# Patient Record
Sex: Female | Born: 1995 | Race: White | Hispanic: No | Marital: Single | Smoking: Never smoker
Health system: Southern US, Community
[De-identification: ages and names within clinical notes are randomized; demographics above are authoritative.]

## PROBLEM LIST (undated history)

## (undated) DIAGNOSIS — K589 Irritable bowel syndrome without diarrhea: Secondary | ICD-10-CM

## (undated) DIAGNOSIS — R51 Headache: Secondary | ICD-10-CM

## (undated) DIAGNOSIS — R519 Headache, unspecified: Secondary | ICD-10-CM

## (undated) DIAGNOSIS — R109 Unspecified abdominal pain: Secondary | ICD-10-CM

## (undated) DIAGNOSIS — M797 Fibromyalgia: Secondary | ICD-10-CM

## (undated) DIAGNOSIS — K219 Gastro-esophageal reflux disease without esophagitis: Secondary | ICD-10-CM

## (undated) DIAGNOSIS — R634 Abnormal weight loss: Secondary | ICD-10-CM

## (undated) HISTORY — PX: CHOLECYSTECTOMY: SHX55

## (undated) HISTORY — DX: Abnormal weight loss: R63.4

---

## 2016-01-07 ENCOUNTER — Emergency Department (HOSPITAL_COMMUNITY)
Admission: EM | Admit: 2016-01-07 | Discharge: 2016-01-07 | Disposition: A | Discharge status: Home / Self Care | Payer: BC Managed Care – PPO | Attending: Emergency Medicine | Admitting: Emergency Medicine

## 2016-01-07 ENCOUNTER — Encounter (HOSPITAL_COMMUNITY): Payer: Self-pay | Admitting: *Deleted

## 2016-01-07 DIAGNOSIS — R002 Palpitations: Secondary | ICD-10-CM | POA: Diagnosis not present

## 2016-01-07 DIAGNOSIS — R110 Nausea: Secondary | ICD-10-CM | POA: Diagnosis not present

## 2016-01-07 DIAGNOSIS — Z79899 Other long term (current) drug therapy: Secondary | ICD-10-CM | POA: Insufficient documentation

## 2016-01-07 DIAGNOSIS — R200 Anesthesia of skin: Secondary | ICD-10-CM | POA: Diagnosis not present

## 2016-01-07 DIAGNOSIS — R072 Precordial pain: Secondary | ICD-10-CM | POA: Insufficient documentation

## 2016-01-07 DIAGNOSIS — R000 Tachycardia, unspecified: Secondary | ICD-10-CM | POA: Diagnosis present

## 2016-01-07 LAB — CBC WITH DIFFERENTIAL/PLATELET
BASOS ABS: 0 10*3/uL (ref 0.0–0.1)
Basophils Relative: 0 %
EOS PCT: 0 %
Eosinophils Absolute: 0 10*3/uL (ref 0.0–0.7)
HEMATOCRIT: 40.1 % (ref 36.0–46.0)
Hemoglobin: 14.2 g/dL (ref 12.0–15.0)
LYMPHS ABS: 2.2 10*3/uL (ref 0.7–4.0)
LYMPHS PCT: 27 %
MCH: 30.2 pg (ref 26.0–34.0)
MCHC: 35.4 g/dL (ref 30.0–36.0)
MCV: 85.3 fL (ref 78.0–100.0)
MONOS PCT: 5 %
Monocytes Absolute: 0.4 10*3/uL (ref 0.1–1.0)
Neutro Abs: 5.4 10*3/uL (ref 1.7–7.7)
Neutrophils Relative %: 68 %
PLATELETS: 241 10*3/uL (ref 150–400)
RBC: 4.7 MIL/uL (ref 3.87–5.11)
RDW: 12.6 % (ref 11.5–15.5)
WBC: 8.1 10*3/uL (ref 4.0–10.5)

## 2016-01-07 LAB — TSH: TSH: 0.466 u[IU]/mL (ref 0.350–4.500)

## 2016-01-07 LAB — BASIC METABOLIC PANEL
ANION GAP: 5 (ref 5–15)
BUN: 16 mg/dL (ref 6–20)
CALCIUM: 8.4 mg/dL — AB (ref 8.9–10.3)
CO2: 21 mmol/L — AB (ref 22–32)
CREATININE: 0.55 mg/dL (ref 0.44–1.00)
Chloride: 106 mmol/L (ref 101–111)
Glucose, Bld: 80 mg/dL (ref 65–99)
Potassium: 3.4 mmol/L — ABNORMAL LOW (ref 3.5–5.1)
SODIUM: 132 mmol/L — AB (ref 135–145)

## 2016-01-07 LAB — TROPONIN I: Troponin I: <0.03 ng/mL (ref <0.03)

## 2016-01-07 LAB — POC URINE PREG, ED: PREG TEST UR: NEGATIVE

## 2016-01-07 MED ORDER — HYDROCODONE-ACETAMINOPHEN 5-325 MG PO TABS: 1.0000 | ORAL_TABLET | ORAL | 0 refills | Status: DC | PRN

## 2016-01-07 MED ORDER — HYDROCODONE-ACETAMINOPHEN 5-325 MG PO TABS
1.0000 | ORAL_TABLET | Freq: Once | ORAL | Status: AC
  Administered 2016-01-07: 1 via ORAL
  Filled 2016-01-07: qty 1

## 2016-01-07 MED ORDER — TRAMADOL HCL 50 MG PO TABS
50.0000 mg | ORAL_TABLET | Freq: Once | ORAL | Status: DC
  Filled 2016-01-07: qty 1

## 2016-01-07 NOTE — ED Provider Notes (Signed)
[facility]-EMERGENCY DEPT Provider Note   CSN: [id number] Arrival date & time: 01/07/16  1845     History   Chief Complaint Chief Complaint  Patient presents with  . Tachycardia    HPI Misty Figueroa is a 20 y.o. female presenting with at least a several week history of palpitations and general feeling of weakness with these episodes.  She describes intermittent episodes of heart racing which can occur randomly, at rest or with exertion and has been more persistent today describing episodes lasting a few minutes throughout the day.  She has also developed new midsternal chest pain today which is sharp and aching in character without radiation. She was seen by her pcp for this yesterday whom she said wasn't listening to her and prescribed zoloft.  She denies depression.  She is undergoing testing for vague symptoms including weakness, intermittent hives and and allover body aches In addition to all over body tingling which is intermittent and has been seen by a rheumatologist and an allergist who have been unable to identify the source of her symptoms. She denies fevers,chills, but does have intermittent nausea and hot flashes followed by cold chills.  Her weight has been stable.  The history is provided by the patient and a parent.    Past Medical History:  Diagnosis Date  . Premature baby    born at 6627 weeks    There are no active problems to display for this patient.   Past Surgical History:  Procedure Laterality Date  . CHOLECYSTECTOMY      OB History    No data available       Home Medications    Prior to Admission medications   Medication Sig Start Date End Date Taking? Authorizing Provider  fexofenadine (ALLEGRA) 180 MG tablet Take 180 mg by mouth daily.   Yes Historical Provider, MD  Norethindrone-Ethinyl Estradiol-Fe Biphas (LO LOESTRIN FE) 1 MG-10 MCG / 10 MCG tablet Take 1 tablet by mouth daily.   Yes Historical Provider, MD  sertraline (ZOLOFT) 50 MG tablet Take 50  mg by mouth daily. 01/06/16  Yes Historical Provider, MD  HYDROcodone-acetaminophen (NORCO/VICODIN) 5-325 MG tablet Take 1 tablet by mouth every 4 (four) hours as needed. 01/07/16   Burgess AmorJulie Cinderella Christoffersen, PA-C    Family History No family history on file.  Social History Social History  Substance Use Topics  . Smoking status: Never Smoker  . Smokeless tobacco: Never Used  . Alcohol use No     Allergies   Penicillins and Prochlorperazine   Review of Systems Review of Systems  Constitutional: Positive for chills and diaphoresis. Negative for fever.  HENT: Negative for congestion and sore throat.   Eyes: Negative.   Respiratory: Negative for chest tightness and shortness of breath.   Cardiovascular: Positive for chest pain and palpitations.  Gastrointestinal: Positive for nausea. Negative for abdominal pain and vomiting.  Endocrine: Negative for polydipsia and polyuria.  Genitourinary: Negative.   Musculoskeletal: Negative for arthralgias, joint swelling and neck pain.  Skin: Negative.  Negative for rash and wound.  Neurological: Positive for weakness and numbness. Negative for dizziness, light-headedness and headaches.  Psychiatric/Behavioral: Negative.      Physical Exam Updated Vital Signs BP 104/68   Pulse 75   Temp 97.9 F (36.6 C) (Oral)   Resp 24   Ht 5\' 4"  (1.626 m)   Wt 43.1 kg   LMP 01/03/2016   SpO2 97%   BMI 16.31 kg/m   Physical Exam  Constitutional: She appears  well-developed and well-nourished.  HENT:  [place]: Normocephalic and atraumatic.  Eyes: Conjunctivae are normal.  Neck: Normal range of motion.  Cardiovascular: Normal rate, regular rhythm, normal heart sounds and intact distal pulses.   No murmur heard. No palpitations, arrhythmia or episodes of tachycardia while on the monitor.  Pulmonary/Chest: Effort normal and breath sounds normal. She has no wheezes.  Abdominal: Soft. Bowel sounds are normal. There is no tenderness.  Musculoskeletal: Normal range  of motion.  Neurological: She is alert. She has normal reflexes. No cranial nerve deficit. She exhibits normal muscle tone. Coordination normal.  Skin: Skin is warm and dry.  Psychiatric: She has a normal mood and affect.  Nursing note and vitals reviewed.    ED Treatments / Results  Labs (all labs ordered are listed, but only abnormal results are displayed)  Results for orders placed or performed during the hospital encounter of 01/07/16  CBC with Differential  Result Value Ref Range   WBC 8.1 4.0 - 10.5 K/uL   RBC 4.70 3.87 - 5.11 MIL/uL   Hemoglobin 14.2 12.0 - 15.0 g/dL   HCT 16.1 09.6 - 04.5 %   MCV 85.3 78.0 - 100.0 fL   MCH 30.2 26.0 - 34.0 pg   MCHC 35.4 30.0 - 36.0 g/dL   RDW 40.9 81.1 - 91.4 %   Platelets 241 150 - 400 K/uL   Neutrophils Relative % 68 %   Neutro Abs 5.4 1.7 - 7.7 K/uL   Lymphocytes Relative 27 %   Lymphs Abs 2.2 0.7 - 4.0 K/uL   Monocytes Relative 5 %   Monocytes Absolute 0.4 0.1 - 1.0 K/uL   Eosinophils Relative 0 %   Eosinophils Absolute 0.0 0.0 - 0.7 K/uL   Basophils Relative 0 %   Basophils Absolute 0.0 0.0 - 0.1 K/uL  Basic metabolic panel  Result Value Ref Range   Sodium 132 (L) 135 - 145 mmol/L   Potassium 3.4 (L) 3.5 - 5.1 mmol/L   Chloride 106 101 - 111 mmol/L   CO2 21 (L) 22 - 32 mmol/L   Glucose, Bld 80 65 - 99 mg/dL   BUN 16 6 - 20 mg/dL   Creatinine, Ser 7.82 0.44 - 1.00 mg/dL   Calcium 8.4 (L) 8.9 - 10.3 mg/dL   GFR calc non Af Amer >60 >60 mL/min   GFR calc Af Amer >60 >60 mL/min   Anion gap 5 5 - 15  Troponin I  Result Value Ref Range   Troponin I <0.03 <0.03 ng/mL  TSH  Result Value Ref Range   TSH 0.466 0.350 - 4.500 uIU/mL  POC urine preg, ED (not at [facility])  Result Value Ref Range   Preg Test, Ur NEGATIVE NEGATIVE   No results found.  EKG  EKG Interpretation  Date/Time:  Thursday January 07 2016 22:34:04 EDT Ventricular Rate:  69 PR Interval:    QRS Duration: 75 QT Interval:  381 QTC Calculation: 409 R  Axis:   66 Text Interpretation:  Sinus rhythm Short PR interval No STEMI. Similar to prior tracing.  Confirmed by LONG MD, JOSHUA [phone]) on 01/07/2016 10:37:38 PM       Radiology No results found.  Procedures Procedures (including critical care time)  Medications Ordered in ED Medications  HYDROcodone-acetaminophen (NORCO/VICODIN) 5-325 MG per tablet 1 tablet (1 tablet Oral Given 01/07/16 2346)     Initial Impression / Assessment and Plan / ED Course  I have reviewed the triage vital signs and the nursing notes.  Pertinent labs & imaging results that were available during my care of the patient were reviewed by me and considered in my medical decision making (see chart for details).  Clinical Course    History of palpitations with no symptoms during this ED visit.  Labs and EKG reviewed.  Patient advise follow-up with her PCP when necessary.  She endorses interest in locating a closer PCP to this area and was given referrals including Dr. Selena Batten or Karilyn Cota.  Also referred to cardiology, she may benefit from a Holter monitor to further define palpitations unable to see during tonight's visit.  During the visit she had exacerbation overall over body pain.  She was given hydrocodone tablet with a prescription for a small supply.  She has been prescribed naproxen and meloxicam in the past which does not improve the symptoms.  Final Clinical Impressions(s) / ED Diagnoses   Final diagnoses:  Palpitations    New Prescriptions New Prescriptions   HYDROCODONE-ACETAMINOPHEN (NORCO/VICODIN) 5-325 MG TABLET    Take 1 tablet by mouth every 4 (four) hours as needed.     Burgess Amor, PA-C 01/07/16 2348    Maia Plan, MD 01/08/16 [phone]

## 2016-01-07 NOTE — ED Triage Notes (Signed)
Pt c/o feeling like her heart is beating fast, feeling weak for the past few weeks, was seen by pcp yesterday and was placed on zoloft 50 mg daily, chest pain started today,

## 2016-01-21 ENCOUNTER — Emergency Department (HOSPITAL_COMMUNITY): Payer: BC Managed Care – PPO

## 2016-01-21 ENCOUNTER — Encounter (HOSPITAL_COMMUNITY): Payer: Self-pay

## 2016-01-21 ENCOUNTER — Emergency Department (HOSPITAL_COMMUNITY)
Admission: EM | Admit: 2016-01-21 | Discharge: 2016-01-21 | Disposition: A | Discharge status: Home / Self Care | Payer: BC Managed Care – PPO | Attending: Emergency Medicine | Admitting: Emergency Medicine

## 2016-01-21 DIAGNOSIS — Z79899 Other long term (current) drug therapy: Secondary | ICD-10-CM | POA: Insufficient documentation

## 2016-01-21 DIAGNOSIS — K529 Noninfective gastroenteritis and colitis, unspecified: Secondary | ICD-10-CM | POA: Insufficient documentation

## 2016-01-21 DIAGNOSIS — R109 Unspecified abdominal pain: Secondary | ICD-10-CM | POA: Diagnosis present

## 2016-01-21 LAB — URINALYSIS, ROUTINE W REFLEX MICROSCOPIC
BILIRUBIN URINE: NEGATIVE
GLUCOSE, UA: NEGATIVE mg/dL
KETONES UR: NEGATIVE mg/dL
LEUKOCYTES UA: NEGATIVE
Nitrite: NEGATIVE
PROTEIN: NEGATIVE mg/dL
Specific Gravity, Urine: 1.02 (ref 1.005–1.030)
pH: 6 (ref 5.0–8.0)

## 2016-01-21 LAB — I-STAT BETA HCG BLOOD, ED (MC, WL, AP ONLY)

## 2016-01-21 LAB — COMPREHENSIVE METABOLIC PANEL
ALT: 14 U/L (ref 14–54)
AST: 17 U/L (ref 15–41)
Albumin: 4.4 g/dL (ref 3.5–5.0)
Alkaline Phosphatase: 60 U/L (ref 38–126)
Anion gap: 6 (ref 5–15)
BUN: 8 mg/dL (ref 6–20)
CHLORIDE: 104 mmol/L (ref 101–111)
CO2: 27 mmol/L (ref 22–32)
CREATININE: 0.69 mg/dL (ref 0.44–1.00)
Calcium: 8.9 mg/dL (ref 8.9–10.3)
Glucose, Bld: 103 mg/dL — ABNORMAL HIGH (ref 65–99)
POTASSIUM: 3.3 mmol/L — AB (ref 3.5–5.1)
SODIUM: 137 mmol/L (ref 135–145)
TOTAL PROTEIN: 7.8 g/dL (ref 6.5–8.1)
Total Bilirubin: 0.4 mg/dL (ref 0.3–1.2)

## 2016-01-21 LAB — URINE MICROSCOPIC-ADD ON

## 2016-01-21 LAB — CBC WITH DIFFERENTIAL/PLATELET
Basophils Absolute: 0 10*3/uL (ref 0.0–0.1)
Basophils Relative: 0 %
EOS ABS: 0.1 10*3/uL (ref 0.0–0.7)
Eosinophils Relative: 2 %
HEMATOCRIT: 41 % (ref 36.0–46.0)
HEMOGLOBIN: 14.2 g/dL (ref 12.0–15.0)
LYMPHS ABS: 2.4 10*3/uL (ref 0.7–4.0)
LYMPHS PCT: 32 %
MCH: 30.2 pg (ref 26.0–34.0)
MCHC: 34.6 g/dL (ref 30.0–36.0)
MCV: 87.2 fL (ref 78.0–100.0)
MONOS PCT: 6 %
Monocytes Absolute: 0.4 10*3/uL (ref 0.1–1.0)
NEUTROS PCT: 60 %
Neutro Abs: 4.5 10*3/uL (ref 1.7–7.7)
Platelets: 241 10*3/uL (ref 150–400)
RBC: 4.7 MIL/uL (ref 3.87–5.11)
RDW: 13.2 % (ref 11.5–15.5)
WBC: 7.4 10*3/uL (ref 4.0–10.5)

## 2016-01-21 MED ORDER — ONDANSETRON 4 MG PO TBDP: ORAL_TABLET | ORAL | 0 refills | Status: DC

## 2016-01-21 MED ORDER — HYDROCODONE-ACETAMINOPHEN 5-325 MG PO TABS: 1.0000 | ORAL_TABLET | Freq: Four times a day (QID) | ORAL | 0 refills | Status: DC | PRN

## 2016-01-21 MED ORDER — SODIUM CHLORIDE 0.9 % IV BOLUS (SEPSIS)
1000.0000 mL | Freq: Once | INTRAVENOUS | Status: AC
  Administered 2016-01-21: 1000 mL via INTRAVENOUS

## 2016-01-21 MED ORDER — HYDROMORPHONE HCL 1 MG/ML IJ SOLN
0.5000 mg | Freq: Once | INTRAMUSCULAR | Status: AC
  Administered 2016-01-21: 0.5 mg via INTRAVENOUS
  Filled 2016-01-21: qty 1

## 2016-01-21 MED ORDER — IOPAMIDOL (ISOVUE-300) INJECTION 61%
INTRAVENOUS | Status: AC
  Filled 2016-01-21: qty 30

## 2016-01-21 MED ORDER — IOPAMIDOL (ISOVUE-300) INJECTION 61%
100.0000 mL | Freq: Once | INTRAVENOUS | Status: AC | PRN
  Administered 2016-01-21: 100 mL via INTRAVENOUS

## 2016-01-21 MED ORDER — OXYCODONE-ACETAMINOPHEN 5-325 MG PO TABS
1.0000 | ORAL_TABLET | Freq: Once | ORAL | Status: AC
  Administered 2016-01-21: 1 via ORAL
  Filled 2016-01-21: qty 1

## 2016-01-21 MED ORDER — CIPROFLOXACIN HCL 250 MG PO TABS
500.0000 mg | ORAL_TABLET | Freq: Once | ORAL | Status: AC
  Administered 2016-01-21: 500 mg via ORAL
  Filled 2016-01-21: qty 2

## 2016-01-21 MED ORDER — ONDANSETRON HCL 4 MG/2ML IJ SOLN
4.0000 mg | Freq: Once | INTRAMUSCULAR | Status: AC
  Administered 2016-01-21: 4 mg via INTRAVENOUS
  Filled 2016-01-21: qty 2

## 2016-01-21 MED ORDER — METRONIDAZOLE 500 MG PO TABS: ORAL_TABLET | ORAL | 0 refills | Status: DC

## 2016-01-21 MED ORDER — METRONIDAZOLE 500 MG PO TABS
500.0000 mg | ORAL_TABLET | Freq: Once | ORAL | Status: AC
  Administered 2016-01-21: 500 mg via ORAL
  Filled 2016-01-21: qty 1

## 2016-01-21 MED ORDER — CIPROFLOXACIN HCL 500 MG PO TABS: 500.0000 mg | ORAL_TABLET | Freq: Two times a day (BID) | ORAL | 0 refills | Status: DC

## 2016-01-21 NOTE — Discharge Instructions (Signed)
Follow up with your md next week. °

## 2016-01-21 NOTE — ED Notes (Signed)
MD at bedside. 

## 2016-01-21 NOTE — ED Triage Notes (Signed)
Pt reports abd pain and nausea and has had bright red blood in stools for the past week.

## 2016-01-21 NOTE — ED Provider Notes (Signed)
[facility] Provider Note   CSN: [id number] Arrival date & time: 01/21/16  1819     History   Chief Complaint Chief Complaint  Patient presents with  . Abdominal Pain  . Rectal Bleeding    HPI [name] is a 20 y.o. female.  Patient complains of abdominal pain and she's had some blood in her stool   The history is provided by the patient. No language interpreter was used.  Abdominal Pain   This is a new problem. The current episode started 12 to 24 hours ago. The problem occurs constantly. The problem has not changed since onset.The pain is associated with an unknown factor. The pain is located in the suprapubic region. The quality of the pain is aching. The pain is at a severity of 5/10. The pain is moderate. Associated symptoms include hematochezia. Pertinent negatives include diarrhea, frequency, hematuria and headaches. Nothing aggravates the symptoms. Nothing relieves the symptoms.  Rectal Bleeding  Associated symptoms: abdominal pain     Past Medical History:  Diagnosis Date  . Premature baby    born at 7127 weeks    There are no active problems to display for this patient.   Past Surgical History:  Procedure Laterality Date  . CHOLECYSTECTOMY      OB History    No data available       Home Medications    Prior to Admission medications   Medication Sig Start Date End Date Taking? Authorizing Provider  fexofenadine (ALLEGRA) 180 MG tablet Take 180 mg by mouth daily.   Yes Historical Provider, MD  Norethindrone-Ethinyl Estradiol-Fe Biphas (LO LOESTRIN FE) 1 MG-10 MCG / 10 MCG tablet Take 1 tablet by mouth daily.   Yes Historical Provider, MD  ciprofloxacin (CIPRO) 500 MG tablet Take 1 tablet (500 mg total) by mouth 2 (two) times daily. One po bid x 7 days 01/21/16   Bethann BerkshireJoseph Narada Uzzle, MD  HYDROcodone-acetaminophen (NORCO/VICODIN) 5-325 MG tablet Take 1 tablet by mouth every 6 (six) hours as needed for moderate pain. 01/21/16   Bethann BerkshireJoseph Michell Giuliano, MD    metroNIDAZOLE (FLAGYL) 500 MG tablet One po tis 01/21/16   Bethann BerkshireJoseph Destiny Trickey, MD  ondansetron [facility](ZOFRAN ODT) 4 MG disintegrating tablet 4mg  ODT q4 hours prn nausea/vomit 01/21/16   Bethann BerkshireJoseph Milford Cilento, MD    Family History No family history on file.  Social History Social History  Substance Use Topics  . Smoking status: Never Smoker  . Smokeless tobacco: Never Used  . Alcohol use No     Allergies   Penicillins and Prochlorperazine   Review of Systems Review of Systems  Constitutional: Negative for appetite change and fatigue.  HENT: Negative for congestion, ear discharge and sinus pressure.   Eyes: Negative for discharge.  Respiratory: Negative for cough.   Cardiovascular: Negative for chest pain.  Gastrointestinal: Positive for abdominal pain and hematochezia. Negative for diarrhea.  Genitourinary: Negative for frequency and hematuria.  Musculoskeletal: Negative for back pain.  Skin: Negative for rash.  Neurological: Negative for seizures and headaches.  Psychiatric/Behavioral: Negative for hallucinations.     Physical Exam Updated Vital Signs BP 109/71   Pulse 72   Temp 98 F (36.7 C) (Oral)   Resp 20   Ht 5\' 4"  (1.626 m)   Wt 93 lb 1.6 oz (42.2 kg)   LMP 01/03/2016   SpO2 100%   BMI 15.98 kg/m   Physical Exam  Constitutional: She is oriented to person, place, and time. She appears well-developed.  HENT:  [place]: Normocephalic.  Eyes: Conjunctivae and EOM are normal. No scleral icterus.  Neck: Neck supple. No thyromegaly present.  Cardiovascular: Normal rate and regular rhythm.  Exam reveals no gallop and no friction rub.   No murmur heard. Pulmonary/Chest: No stridor. She has no wheezes. She has no rales. She exhibits no tenderness.  Abdominal: She exhibits no distension. There is tenderness. There is no rebound.  Musculoskeletal: Normal range of motion. She exhibits no edema.  Lymphadenopathy:    She has no cervical adenopathy.  Neurological: She is oriented to  person, place, and time. She exhibits normal muscle tone. Coordination normal.  Skin: No rash noted. No erythema.  Psychiatric: She has a normal mood and affect. Her behavior is normal.     ED Treatments / Results  Labs (all labs ordered are listed, but only abnormal results are displayed) Labs Reviewed  COMPREHENSIVE METABOLIC PANEL - Abnormal; Notable for the following:       Result Value   Potassium 3.3 (*)    Glucose, Bld 103 (*)    All other components within normal limits  URINALYSIS, ROUTINE W REFLEX MICROSCOPIC (NOT AT [facility]) - Abnormal; Notable for the following:    Hgb urine dipstick TRACE (*)    All other components within normal limits  URINE MICROSCOPIC-ADD ON - Abnormal; Notable for the following:    Squamous Epithelial / LPF 6-30 (*)    Bacteria, UA MANY (*)    All other components within normal limits  URINE CULTURE  CBC WITH DIFFERENTIAL/PLATELET  I-STAT BETA HCG BLOOD, ED ([facility], [facility], [facility] ONLY)    EKG  EKG Interpretation None       Radiology Ct Abdomen Pelvis W Contrast  Result Date: 01/21/2016 CLINICAL DATA:  Abdominal pain, nausea and bright red blood in stools for past week EXAM: CT ABDOMEN AND PELVIS WITH CONTRAST TECHNIQUE: Multidetector CT imaging of the abdomen and pelvis was performed using the standard protocol following bolus administration of intravenous contrast. CONTRAST:  ISOVUE-300 IOPAMIDOL (ISOVUE-300) INJECTION 61% COMPARISON:  None. FINDINGS: LOWER CHEST: Lung bases are clear. Included heart size is normal. No pericardial effusion. HEPATOBILIARY: Liver enhances homogeneously without space-occupying mass. Cholecystectomy. No ductal dilatation. PANCREAS: Normal. SPLEEN: Normal. ADRENALS/URINARY TRACT: Kidneys are orthotopic, demonstrating symmetric enhancement. No nephrolithiasis, hydronephrosis or solid renal masses. The unopacified ureters are normal in course and caliber. Delayed imaging through the kidneys demonstrates symmetric prompt  contrast excretion within the proximal urinary collecting system. Urinary bladder is physiologically distended but otherwise unremarkable. Normal adrenal glands. STOMACH/BOWEL: The stomach, small and large bowel are normal in course. There is a moderate amount of fecal residue throughout large bowel. There is a transmural thickening of descending colon suspicious for colitis, series 4, image 36 and 37. Lack of oral contrast in this region limits further assessment. VASCULAR/LYMPHATIC: Aortoiliac vessels are normal in course and caliber. No lymphadenopathy by CT size criteria. REPRODUCTIVE: Normal.  Follicles noted both ovaries. OTHER: No intraperitoneal free fluid or free air. MUSCULOSKELETAL: Nonacute. IMPRESSION: Findings suspicious for mild colitis of the descending colon. Electronically Signed   By: Tollie Eth M.D.   On: 01/21/2016 23:20    Procedures Procedures (including critical care time)  Medications Ordered in ED Medications  iopamidol (ISOVUE-300) 61 % injection (not administered)  metroNIDAZOLE (FLAGYL) tablet 500 mg (not administered)  ciprofloxacin (CIPRO) tablet 500 mg (not administered)  ondansetron (ZOFRAN) injection 4 mg (4 mg Intravenous Given 01/21/16 1901)  sodium chloride 0.9 % bolus 1,000 mL (0 mLs Intravenous Stopped 01/21/16 2139)  HYDROmorphone (DILAUDID) injection 0.5 mg (0.5 mg Intravenous Given 01/21/16 1901)  iopamidol (ISOVUE-300) 61 % injection 100 mL (100 mLs Intravenous Contrast Given 01/21/16 2241)     Initial Impression / Assessment and Plan / ED Course  I have reviewed the triage vital signs and the nursing notes.  Pertinent labs & imaging results that were available during my care of the patient were reviewed by me and considered in my medical decision making (see chart for details).  Clinical Course   Patient with colitis. She will be treated with Flagyl Cipro Vicodin and Zofran and follow-up with her PCP  Final Clinical Impressions(s) / ED Diagnoses    Final diagnoses:  Colitis    New Prescriptions New Prescriptions   CIPROFLOXACIN (CIPRO) 500 MG TABLET    Take 1 tablet (500 mg total) by mouth 2 (two) times daily. One po bid x 7 days   HYDROCODONE-ACETAMINOPHEN (NORCO/VICODIN) 5-325 MG TABLET    Take 1 tablet by mouth every 6 (six) hours as needed for moderate pain.   METRONIDAZOLE (FLAGYL) 500 MG TABLET    One po tis   ONDANSETRON (ZOFRAN ODT) 4 MG DISINTEGRATING TABLET    4mg  ODT q4 hours prn nausea/vomit     Bethann Berkshire, MD 01/21/16 [phone]

## 2016-01-23 LAB — URINE CULTURE: Culture: NO GROWTH

## 2016-01-24 ENCOUNTER — Encounter (HOSPITAL_COMMUNITY): Payer: Self-pay

## 2016-01-24 DIAGNOSIS — R1084 Generalized abdominal pain: Secondary | ICD-10-CM | POA: Insufficient documentation

## 2016-01-24 DIAGNOSIS — K5900 Constipation, unspecified: Secondary | ICD-10-CM | POA: Diagnosis not present

## 2016-01-24 DIAGNOSIS — R1032 Left lower quadrant pain: Secondary | ICD-10-CM | POA: Insufficient documentation

## 2016-01-24 DIAGNOSIS — R1031 Right lower quadrant pain: Secondary | ICD-10-CM | POA: Diagnosis present

## 2016-01-24 DIAGNOSIS — R110 Nausea: Secondary | ICD-10-CM | POA: Insufficient documentation

## 2016-01-24 NOTE — ED Triage Notes (Signed)
I was here on Thursday and diagnosed with an infection in my colon.  They put me on Cipro and Flagyl and I am feeling worse today.  I feel bloated, clammy, and I am not feeling right.  Still having pain in abdomen.  When given a hydrocodone for pain, it just knocks her out and she wakes up still in pain.

## 2016-01-25 ENCOUNTER — Emergency Department (HOSPITAL_COMMUNITY)
Admission: EM | Admit: 2016-01-25 | Discharge: 2016-01-25 | Disposition: A | Discharge status: Home / Self Care | Payer: BC Managed Care – PPO | Attending: Emergency Medicine | Admitting: Emergency Medicine

## 2016-01-25 ENCOUNTER — Emergency Department (HOSPITAL_COMMUNITY): Payer: BC Managed Care – PPO

## 2016-01-25 ENCOUNTER — Encounter (HOSPITAL_COMMUNITY): Payer: Self-pay | Admitting: Radiology

## 2016-01-25 DIAGNOSIS — R1084 Generalized abdominal pain: Secondary | ICD-10-CM

## 2016-01-25 LAB — CBC WITH DIFFERENTIAL/PLATELET
BASOS ABS: 0 10*3/uL (ref 0.0–0.1)
Basophils Relative: 0 %
Eosinophils Absolute: 0.1 10*3/uL (ref 0.0–0.7)
Eosinophils Relative: 1 %
HEMATOCRIT: 42.9 % (ref 36.0–46.0)
Hemoglobin: 14.7 g/dL (ref 12.0–15.0)
LYMPHS ABS: 2.5 10*3/uL (ref 0.7–4.0)
LYMPHS PCT: 23 %
MCH: 29.9 pg (ref 26.0–34.0)
MCHC: 34.3 g/dL (ref 30.0–36.0)
MCV: 87.2 fL (ref 78.0–100.0)
Monocytes Absolute: 0.6 10*3/uL (ref 0.1–1.0)
Monocytes Relative: 5 %
NEUTROS ABS: 7.6 10*3/uL (ref 1.7–7.7)
NEUTROS PCT: 71 %
Platelets: 267 10*3/uL (ref 150–400)
RBC: 4.92 MIL/uL (ref 3.87–5.11)
RDW: 13 % (ref 11.5–15.5)
WBC: 10.8 10*3/uL — ABNORMAL HIGH (ref 4.0–10.5)

## 2016-01-25 LAB — URINE MICROSCOPIC-ADD ON

## 2016-01-25 LAB — COMPREHENSIVE METABOLIC PANEL
ALK PHOS: 66 U/L (ref 38–126)
ALT: 25 U/L (ref 14–54)
AST: 30 U/L (ref 15–41)
Albumin: 4.6 g/dL (ref 3.5–5.0)
Anion gap: 11 (ref 5–15)
BILIRUBIN TOTAL: 0.6 mg/dL (ref 0.3–1.2)
BUN: 19 mg/dL (ref 6–20)
CALCIUM: 9.2 mg/dL (ref 8.9–10.3)
CHLORIDE: 102 mmol/L (ref 101–111)
CO2: 22 mmol/L (ref 22–32)
CREATININE: 0.79 mg/dL (ref 0.44–1.00)
GFR calc Af Amer: >60 mL/min (ref >60)
Glucose, Bld: 72 mg/dL (ref 65–99)
Potassium: 3.5 mmol/L (ref 3.5–5.1)
Sodium: 135 mmol/L (ref 135–145)
TOTAL PROTEIN: 7.9 g/dL (ref 6.5–8.1)

## 2016-01-25 LAB — URINALYSIS, ROUTINE W REFLEX MICROSCOPIC
Bilirubin Urine: NEGATIVE
GLUCOSE, UA: NEGATIVE mg/dL
HGB URINE DIPSTICK: NEGATIVE
Nitrite: NEGATIVE
PH: 5.5 (ref 5.0–8.0)
Protein, ur: NEGATIVE mg/dL
Specific Gravity, Urine: >1.03 — ABNORMAL HIGH (ref 1.005–1.030)

## 2016-01-25 LAB — LIPASE, BLOOD: Lipase: 22 U/L (ref 11–51)

## 2016-01-25 LAB — POC URINE PREG, ED: Preg Test, Ur: NEGATIVE

## 2016-01-25 LAB — I-STAT CG4 LACTIC ACID, ED: LACTIC ACID, VENOUS: 1.46 mmol/L (ref 0.5–1.9)

## 2016-01-25 MED ORDER — POLYETHYLENE GLYCOL 3350 17 G PO PACK: 17.0000 g | PACK | Freq: Every day | ORAL | 0 refills | Status: DC

## 2016-01-25 MED ORDER — KETOROLAC TROMETHAMINE 30 MG/ML IJ SOLN
INTRAMUSCULAR | Status: AC
  Filled 2016-01-25: qty 1

## 2016-01-25 MED ORDER — ONDANSETRON HCL 4 MG/2ML IJ SOLN
4.0000 mg | Freq: Once | INTRAMUSCULAR | Status: AC
  Administered 2016-01-25: 4 mg via INTRAVENOUS
  Filled 2016-01-25: qty 2

## 2016-01-25 MED ORDER — DICYCLOMINE HCL 20 MG PO TABS: 20.0000 mg | ORAL_TABLET | Freq: Three times a day (TID) | ORAL | 0 refills | Status: DC

## 2016-01-25 MED ORDER — IOPAMIDOL (ISOVUE-300) INJECTION 61%
INTRAVENOUS | Status: AC
  Administered 2016-01-25: 01:00:00
  Filled 2016-01-25: qty 30

## 2016-01-25 MED ORDER — KETOROLAC TROMETHAMINE 30 MG/ML IJ SOLN
15.0000 mg | Freq: Once | INTRAMUSCULAR | Status: AC
  Administered 2016-01-25: 15 mg via INTRAVENOUS

## 2016-01-25 MED ORDER — MORPHINE SULFATE (PF) 4 MG/ML IV SOLN
4.0000 mg | Freq: Once | INTRAVENOUS | Status: DC
  Filled 2016-01-25: qty 1

## 2016-01-25 NOTE — ED Provider Notes (Signed)
[facility]Y DEPT Provider Note   CSN: [id number] Arrival date & time: 01/24/16  2238  By signing my name below, I, Misty Figueroa, attest that this documentation has been prepared under the direction and in the presence of Gilda Crease, MD. Electronically Signed: Rosario Figueroa, ED Scribe. 01/25/16. 12:49 AM.  History   Chief Complaint Chief Complaint  Patient presents with  . Abdominal Pain   The history is provided by the patient. No language interpreter was used.   HPI Comments: Misty Figueroa is a 20 y.o. female who presents to the Emergency Department complaining of waxing and waning, constant bilateral lower abdominal pain onset ~5 days ago, worsening tonight PTA. She reports associated nausea, abdominal distension, constipation, and decreased appetite secondary to the onset of her pain. She notes that her last BM was ~3 days ago, however she notes that it was normal at that time. Pt was seen in the ED for same ~4 days prior to coming into the ED today where pertinent imaging revealed that she had Colitis. She was rx'd Flagyl/Ciprofloxacin which she has been taking compliantly and Vicodin and Zofran as needed with minimal relief of her current pain. Denies recent bloody stools, emesis, fever, or any other associated symptoms.   Past Medical History:  Diagnosis Date  . Premature baby    born at 39 weeks   There are no active problems to display for this patient.  Past Surgical History:  Procedure Laterality Date  . CHOLECYSTECTOMY     OB History    No data available     Home Medications    Prior to Admission medications   Medication Sig Start Date End Date Taking? Authorizing Provider  ciprofloxacin (CIPRO) 500 MG tablet Take 1 tablet (500 mg total) by mouth 2 (two) times daily. One po bid x 7 days 01/21/16   Bethann Berkshire, MD  fexofenadine (ALLEGRA) 180 MG tablet Take 180 mg by mouth daily.    Historical Provider, MD  HYDROcodone-acetaminophen  (NORCO/VICODIN) 5-325 MG tablet Take 1 tablet by mouth every 6 (six) hours as needed for moderate pain. 01/21/16   Bethann Berkshire, MD  metroNIDAZOLE (FLAGYL) 500 MG tablet One po tis 01/21/16   Bethann Berkshire, MD  Norethindrone-Ethinyl Estradiol-Fe Biphas (LO LOESTRIN FE) 1 MG-10 MCG / 10 MCG tablet Take 1 tablet by mouth daily.    Historical Provider, MD  ondansetron (ZOFRAN ODT) 4 MG disintegrating tablet 4mg  ODT q4 hours prn nausea/vomit 01/21/16   Bethann Berkshire, MD   Family History No family history on file.  Social History Social History  Substance Use Topics  . Smoking status: Never Smoker  . Smokeless tobacco: Never Used  . Alcohol use No   Allergies   Morphine and related; Penicillins; and Prochlorperazine  Review of Systems Review of Systems  Constitutional: Positive for appetite change (decreased). Negative for fever.  Gastrointestinal: Positive for abdominal distention, abdominal pain (lower), constipation and nausea. Negative for diarrhea and vomiting.  All other systems reviewed and are negative.  Physical Exam Updated Vital Signs BP 116/79 (BP Location: Left Arm)   Pulse 95   Temp 97.9 F (36.6 C) (Oral)   Resp 16   Ht 5\' 4"  (1.626 m)   Wt 93 lb (42.2 kg)   LMP 01/03/2016 (Exact Date)   SpO2 100%   BMI 15.96 kg/m   Physical Exam  Constitutional: She is oriented to person, place, and time. She appears well-developed and well-nourished. No distress.  HENT:  [place]: Normocephalic and  atraumatic.  Right Ear: Hearing normal.  Left Ear: Hearing normal.  Nose: Nose normal.  Mouth/Throat: Oropharynx is clear and moist and mucous membranes are normal.  Eyes: Conjunctivae and EOM are normal. Pupils are equal, round, and reactive to light.  Neck: Normal range of motion. Neck supple.  Cardiovascular: Regular rhythm, S1 normal and S2 normal.  Exam reveals no gallop and no friction rub.   No murmur heard. Pulmonary/Chest: Effort normal and breath sounds normal. No  respiratory distress. She exhibits no tenderness.  Abdominal: Soft. Normal appearance and bowel sounds are normal. There is no hepatosplenomegaly. There is tenderness (periumbilical). There is no rebound, no guarding, no tenderness at McBurney's point and negative Murphy's sign. No hernia.  Musculoskeletal: Normal range of motion.  Neurological: She is alert and oriented to person, place, and time. She has normal strength. No cranial nerve deficit or sensory deficit. Coordination normal. GCS eye subscore is 4. GCS verbal subscore is 5. GCS motor subscore is 6.  Skin: Skin is warm, dry and intact. No rash noted. No cyanosis.  Psychiatric: She has a normal mood and affect. Her speech is normal and behavior is normal. Thought content normal.  Nursing note and vitals reviewed.  ED Treatments / Results  DIAGNOSTIC STUDIES: Oxygen Saturation is 100% on RA, normal by my interpretation.   COORDINATION OF CARE: 12:43 AM-Discussed next steps with pt. Pt verbalized understanding and is agreeable with the plan.   Labs (all labs ordered are listed, but only abnormal results are displayed) Labs Reviewed  CBC WITH DIFFERENTIAL/PLATELET - Abnormal; Notable for the following:       Result Value   WBC 10.8 (*)    All other components within normal limits  URINALYSIS, ROUTINE W REFLEX MICROSCOPIC (NOT AT [facility]) - Abnormal; Notable for the following:    Specific Gravity, Urine >1.030 (*)    Ketones, ur >80 (*)    Leukocytes, UA TRACE (*)    All other components within normal limits  URINE MICROSCOPIC-ADD ON - Abnormal; Notable for the following:    Squamous Epithelial / LPF 0-5 (*)    Bacteria, UA MANY (*)    All other components within normal limits  COMPREHENSIVE METABOLIC PANEL  LIPASE, BLOOD  I-STAT CG4 LACTIC ACID, ED  POC URINE PREG, ED   EKG  EKG Interpretation None      Radiology Ct Abdomen Pelvis W Contrast  Result Date: 01/25/2016 CLINICAL DATA:  Worsening abdominal pain, recent  colitis. EXAM: CT ABDOMEN AND PELVIS WITH CONTRAST TECHNIQUE: Multidetector CT imaging of the abdomen and pelvis was performed using the standard protocol following bolus administration of intravenous contrast. CONTRAST:  100 cc Isovue-300 IV COMPARISON:  CT 3 days prior 01/21/2016 FINDINGS: Lower chest: The lung bases are clear. Hepatobiliary: No focal liver abnormality is seen. Status post cholecystectomy. No biliary dilatation. Pancreas: No ductal dilatation or inflammation. Spleen: Normal in size without focal abnormality. Adrenals/Urinary Tract: Adrenal glands are unremarkable. Subcentimeter cyst in the lower left kidney is unchanged. There is no hydronephrosis or perinephric edema. Bladder is unremarkable. Stomach/Bowel: Stomach mildly distended with ingested contrast. The previous descending colonic wall thickening has resolved. Enteric contrast from prior CT within the distal colon. The appendix contains enteric contrast. No new bowel wall thickening. No bowel obstruction. Vascular/Lymphatic: No significant vascular findings are present. No enlarged abdominal or pelvic lymph nodes. Reproductive: Follicular cyst in the left ovary. Uterus is unremarkable. Minimal fluid in the cervix. Right ovary is normal in size. No adnexal mass. Other:  Minimal free fluid in the pelvis is physiologic. No free air or intra-abdominal ascites. Musculoskeletal: There are no acute or suspicious osseous abnormalities. IMPRESSION: 1. Previous colonic wall thickening has resolved. 2. The enteric contrast within the descending colon from CT 3 days prior, can be seen with slow transit. No obstruction. Electronically Signed   By: Rubye OaksMelanie  Ehinger M.D.   On: 01/25/2016 03:19    Procedures Procedures   Medications Ordered in ED Medications  iopamidol (ISOVUE-300) 61 % injection (  Contrast Given 01/25/16 0115)  ondansetron (ZOFRAN) injection 4 mg (4 mg Intravenous Given 01/25/16 0148)  ketorolac (TORADOL) 30 MG/ML injection 15 mg  (15 mg Intravenous Given 01/25/16 0234)    Initial Impression / Assessment and Plan / ED Course  I have reviewed the triage vital signs and the nursing notes.  Pertinent labs & imaging results that were available during my care of the patient were reviewed by me and considered in my medical decision making (see chart for details).  Clinical Course   Patient presents with complaints of abdominal pain. Patient was seen 3 days ago and diagnosed with colitis. She has been on Cipro and Flagyl but reports that the pain never resolved and is now worsening. A repeat CT was performed to rule out abscess and worsening colitis. Bowel wall thickening has resolved. She does have evidence of decreased motility, likely causing some of her pain. Finish course of Cipro and Flagyl, add Bentyl and MiraLAX.  Final Clinical Impressions(s) / ED Diagnoses   Final diagnoses:  Generalized abdominal pain   I personally performed the services described in this documentation, which was scribed in my presence. The recorded information has been reviewed and is accurate.    New Prescriptions New Prescriptions   No medications on file     Gilda Creasehristopher J Declynn Lopresti, MD 01/25/16 [id number]

## 2016-01-26 NOTE — Progress Notes (Signed)
Cardiology Office Note   Date:  01/28/2016   ID:  Misty Figueroa, DOB 1995-09-09, MRN [id number]  PCP:  Hendricks Limes, DO  Cardiologist:   Charlton Haws, MD   No chief complaint on file.     History of Present Illness: Misty Figueroa is a 20 y.o. female who presents for evaluation of palpitations. Seen in ER 01/07/16  Of note been seen twice in ER More recently for colitis being Rx with flagyl and cipro  Has had several week history of palpitations and general feeling of weakness with these episodes.  She describes intermittent episodes of heart racing which can occur randomly, at rest or with exertion and has been more persistent today describing episodes lasting a few minutes throughout the day.  She has also developed midsternal chest pain  which is sharp and aching in character without radiation. She was seen by her pcp for this  whom she said wasn't listening to her and prescribed zoloft.  She denies depression.  She is undergoing testing for vague symptoms including weakness, intermittent hives and and allover body aches In addition to all over body tingling which is intermittent and has been seen by a rheumatologist and an allergist who have been unable to identify the source of her symptoms   Evaluation with no arrhythmia on telemetry normal labs including TSH and normal ECG   Past Medical History:  Diagnosis Date  . Premature baby    born at 72 weeks    Past Surgical History:  Procedure Laterality Date  . CHOLECYSTECTOMY       Current Outpatient Prescriptions  Medication Sig Dispense Refill  . ciprofloxacin (CIPRO) 500 MG tablet Take 1 tablet (500 mg total) by mouth 2 (two) times daily. One po bid x 7 days 20 tablet 0  . dicyclomine (BENTYL) 20 MG tablet Take 1 tablet (20 mg total) by mouth 4 (four) times daily -  before meals and at bedtime. 120 tablet 0  . fexofenadine (ALLEGRA) 180 MG tablet Take 180 mg by mouth daily.    Marland Kitchen HYDROcodone-acetaminophen  (NORCO/VICODIN) 5-325 MG tablet Take 1 tablet by mouth every 6 (six) hours as needed for moderate pain. 20 tablet 0  . metroNIDAZOLE (FLAGYL) 500 MG tablet One po tis 30 tablet 0  . Norethindrone-Ethinyl Estradiol-Fe Biphas (LO LOESTRIN FE) 1 MG-10 MCG / 10 MCG tablet Take 1 tablet by mouth daily.    . ondansetron (ZOFRAN ODT) 4 MG disintegrating tablet 4mg  ODT q4 hours prn nausea/vomit 12 tablet 0  . polyethylene glycol (MIRALAX / GLYCOLAX) packet Take 17 g by mouth daily. 14 each 0   No current facility-administered medications for this visit.     Allergies:   Morphine and related; Penicillins; and Prochlorperazine    Social History:  The patient  reports that she has never smoked. She has never used smokeless tobacco. She reports that she does not drink alcohol or use drugs.   Family History:  The patient's family history includes Diabetes in her paternal grandmother; Hypertension in her father and maternal grandmother; Pulmonary fibrosis in her paternal grandfather; Stroke in her paternal grandmother.    ROS:  Please see the history of present illness.   Otherwise, review of systems are positive for none.   All other systems are reviewed and negative.    PHYSICAL EXAM: VS:  BP 92/62   Temp 99 F (37.2 C)   Ht 5\' 4"  (1.626 m)   Wt 41.3 kg (91 lb)  LMP 01/03/2016 (Exact Date)   SpO2 95%   BMI 15.62 kg/m  , BMI Body mass index is 15.62 kg/m. Affect appropriate Healthy:  appears stated age HEENT: normal Neck supple with no adenopathy JVP normal no bruits no thyromegaly Lungs clear with no wheezing and good diaphragmatic motion Heart:  S1/S2 no murmur, no rub, gallop or click PMI normal Abdomen: benighn, BS positve, no tenderness, no AAA no bruit.  No HSM or HJR Distal pulses intact with no bruits No edema Neuro non-focal Skin warm and dry No muscular weakness    EKG:  9.22.17 SR rate 70 normal ECG    Recent Labs: 01/07/2016: TSH 0.466 01/25/2016: ALT 25; BUN 19;  Creatinine, Ser 0.79; Hemoglobin 14.7; Platelets 267; Potassium 3.5; Sodium 135    Lipid Panel No results found for: CHOL, TRIG, HDL, CHOLHDL, VLDL, LDLCALC, LDLDIRECT    Wt Readings from Last 3 Encounters:  01/28/16 41.3 kg (91 lb)  01/24/16 42.2 kg (93 lb)  01/21/16 42.2 kg (93 lb 1.6 oz)      Other studies Reviewed: Additional studies/ records that were reviewed today include: notes primary ER notes CT scan ECG and labs .    ASSESSMENT AND PLAN:  1.  Palpitations benign but frequent f/u event monitor echo to r/o structural heart dx normal exam 2. Colitis improving by CT continue flagyl f/u GI 3. Arthritis:  She feels poorly not clear if she has connective or autoimmune dx f/u rheum and allergy   Current medicines are reviewed at length with the patient today.  The patient does not have concerns regarding medicines.  The following changes have been made:  no change  Labs/ tests ordered today include: Echo and event monitor  No orders of the defined types were placed in this encounter.    Disposition:   FU with cards PRN      Signed, Charlton HawsPeter Nolberto Cheuvront, MD  01/28/2016 2:38 PM    [facility] [address], [place], [place]  [address] Phone: [phone]; Fax: [phone]

## 2016-01-27 ENCOUNTER — Telehealth: Payer: Self-pay | Admitting: Gastroenterology

## 2016-01-27 NOTE — Telephone Encounter (Signed)
[facility] to call us for a follow up. Please advise if we can accept her as a new patient. [phone]

## 2016-01-27 NOTE — Telephone Encounter (Signed)
Yes

## 2016-01-28 ENCOUNTER — Encounter: Payer: Self-pay | Admitting: Cardiovascular Disease

## 2016-01-28 ENCOUNTER — Encounter: Payer: Self-pay | Admitting: Gastroenterology

## 2016-01-28 ENCOUNTER — Ambulatory Visit (INDEPENDENT_AMBULATORY_CARE_PROVIDER_SITE_OTHER): Payer: BC Managed Care – PPO | Admitting: Cardiovascular Disease

## 2016-01-28 ENCOUNTER — Telehealth: Payer: Self-pay

## 2016-01-28 VITALS — BP 92/62 | Temp 99.0°F | Ht 64.0 in | Wt 91.0 lb

## 2016-01-28 DIAGNOSIS — R002 Palpitations: Secondary | ICD-10-CM | POA: Diagnosis not present

## 2016-01-28 NOTE — Telephone Encounter (Signed)
OV made and letter mailed °

## 2016-01-28 NOTE — Patient Instructions (Signed)
Medication Instructions:  Your physician recommends that you continue on your current medications as directed. Please refer to the Current Medication list given to you today.   Labwork: none  Testing/Procedures: Your physician has recommended that you wear an event monitor. Event monitors are medical devices that record the heart's electrical activity. Doctors most often us these monitors to diagnose arrhythmias. Arrhythmias are problems with the speed or rhythm of the heartbeat. The monitor is a small, portable device. You can wear one while you do your normal daily activities. This is usually used to diagnose what is causing palpitations/syncope (passing out).   ( 21 day event monitor- we will put the order in today and you should receive it in the mail by Monday )   Your physician has requested that you have an echocardiogram. Echocardiography is a painless test that uses sound waves to create images of your heart. It provides your doctor with information about the size and shape of your heart and how well your heart's chambers and valves are working. This procedure takes approximately one hour. There are no restrictions for this procedure.   Follow-Up: Your physician recommends that you schedule a follow-up appointment in: as needed   Any Other Special Instructions Will Be Listed Below (If Applicable).     If you need a refill on your cardiac medications before your next appointment, please call your pharmacy.

## 2016-02-02 ENCOUNTER — Telehealth: Payer: Self-pay | Admitting: Cardiovascular Disease

## 2016-02-02 NOTE — Telephone Encounter (Signed)
Mitch from [employer] will call patient

## 2016-02-02 NOTE — Telephone Encounter (Signed)
Pt called stating she has not received her monitor in the mail yet. Please give her a call @ [phone]

## 2016-02-03 ENCOUNTER — Telehealth: Payer: Self-pay | Admitting: Adult Health

## 2016-02-03 NOTE — Telephone Encounter (Signed)
Called Misty Figueroa to check on the monitor, He stated there was a problem with the phone number- that he had out in a ticket yesterday to get it fixed and that someone from [employer] was calling the pt to let her know.

## 2016-02-03 NOTE — Telephone Encounter (Signed)
Patient questioning why she has not received her event monitor yet. / tg

## 2016-02-05 ENCOUNTER — Encounter (INDEPENDENT_AMBULATORY_CARE_PROVIDER_SITE_OTHER): Payer: BC Managed Care – PPO

## 2016-02-05 DIAGNOSIS — R002 Palpitations: Secondary | ICD-10-CM | POA: Diagnosis not present

## 2016-02-08 ENCOUNTER — Encounter (HOSPITAL_COMMUNITY): Payer: Self-pay | Admitting: Emergency Medicine

## 2016-02-08 ENCOUNTER — Emergency Department (HOSPITAL_COMMUNITY)
Admission: EM | Admit: 2016-02-08 | Discharge: 2016-02-08 | Disposition: A | Discharge status: Home / Self Care | Payer: BC Managed Care – PPO | Attending: Emergency Medicine | Admitting: Emergency Medicine

## 2016-02-08 ENCOUNTER — Ambulatory Visit (HOSPITAL_COMMUNITY): Payer: BC Managed Care – PPO

## 2016-02-08 DIAGNOSIS — R079 Chest pain, unspecified: Secondary | ICD-10-CM | POA: Diagnosis not present

## 2016-02-08 DIAGNOSIS — R1031 Right lower quadrant pain: Secondary | ICD-10-CM | POA: Insufficient documentation

## 2016-02-08 DIAGNOSIS — R1030 Lower abdominal pain, unspecified: Secondary | ICD-10-CM

## 2016-02-08 DIAGNOSIS — L509 Urticaria, unspecified: Secondary | ICD-10-CM | POA: Insufficient documentation

## 2016-02-08 LAB — COMPREHENSIVE METABOLIC PANEL
ALT: 16 U/L (ref 14–54)
ANION GAP: 6 (ref 5–15)
AST: 17 U/L (ref 15–41)
Albumin: 4.1 g/dL (ref 3.5–5.0)
Alkaline Phosphatase: 53 U/L (ref 38–126)
BILIRUBIN TOTAL: 0.4 mg/dL (ref 0.3–1.2)
BUN: 7 mg/dL (ref 6–20)
CHLORIDE: 108 mmol/L (ref 101–111)
CO2: 24 mmol/L (ref 22–32)
Calcium: 8.8 mg/dL — ABNORMAL LOW (ref 8.9–10.3)
Creatinine, Ser: 0.58 mg/dL (ref 0.44–1.00)
Glucose, Bld: 97 mg/dL (ref 65–99)
POTASSIUM: 3.5 mmol/L (ref 3.5–5.1)
Sodium: 138 mmol/L (ref 135–145)
TOTAL PROTEIN: 7.2 g/dL (ref 6.5–8.1)

## 2016-02-08 LAB — CBC
HEMATOCRIT: 39.1 % (ref 36.0–46.0)
HEMOGLOBIN: 13.5 g/dL (ref 12.0–15.0)
MCH: 30.1 pg (ref 26.0–34.0)
MCHC: 34.5 g/dL (ref 30.0–36.0)
MCV: 87.3 fL (ref 78.0–100.0)
Platelets: 248 10*3/uL (ref 150–400)
RBC: 4.48 MIL/uL (ref 3.87–5.11)
RDW: 12.9 % (ref 11.5–15.5)
WBC: 7.7 10*3/uL (ref 4.0–10.5)

## 2016-02-08 LAB — URINALYSIS, ROUTINE W REFLEX MICROSCOPIC
Bilirubin Urine: NEGATIVE
Glucose, UA: NEGATIVE mg/dL
Hgb urine dipstick: NEGATIVE
KETONES UR: NEGATIVE mg/dL
LEUKOCYTES UA: NEGATIVE
NITRITE: NEGATIVE
PH: 6.5 (ref 5.0–8.0)
Protein, ur: NEGATIVE mg/dL
Specific Gravity, Urine: 1.01 (ref 1.005–1.030)

## 2016-02-08 LAB — POC URINE PREG, ED: Preg Test, Ur: NEGATIVE

## 2016-02-08 LAB — LIPASE, BLOOD: LIPASE: 38 U/L (ref 11–51)

## 2016-02-08 MED ORDER — PROMETHAZINE HCL 12.5 MG PO TABS: 12.5000 mg | ORAL_TABLET | Freq: Four times a day (QID) | ORAL | 0 refills | Status: DC | PRN

## 2016-02-08 MED ORDER — HYDROCODONE-ACETAMINOPHEN 5-325 MG PO TABS
1.0000 | ORAL_TABLET | Freq: Once | ORAL | Status: AC | PRN
  Administered 2016-02-08: 1 via ORAL
  Filled 2016-02-08: qty 1

## 2016-02-08 MED ORDER — HYDROCODONE-ACETAMINOPHEN 5-325 MG PO TABS: 1.0000 | ORAL_TABLET | Freq: Four times a day (QID) | ORAL | 0 refills | Status: DC | PRN

## 2016-02-08 MED ORDER — KETOROLAC TROMETHAMINE 30 MG/ML IJ SOLN
15.0000 mg | Freq: Once | INTRAMUSCULAR | Status: AC
  Administered 2016-02-08: 15 mg via INTRAVENOUS
  Filled 2016-02-08: qty 1

## 2016-02-08 NOTE — ED Triage Notes (Signed)
Pt reports generalized abd pain x2 months, diagnosed with colitis a couple weeks ago and given antibiotics. Pt still having n/v with abd pain and intermittent diarrhea.  Pt wearing a heart monitor at this time. Pt alert and oriented.

## 2016-02-08 NOTE — ED Notes (Signed)
EDP Res in now with pt.

## 2016-02-08 NOTE — Discharge Instructions (Signed)
[facility] on Wednesday, 02/10/2016 at 9AM. If you take hydrocodone-acetaminophen for your abdominal pain, please take a stool softener with this. You may buy docusate over the counter. This will prevent constipation. You may take promethazine up to 4 times a day for nausea/vomiting. Please try to stay hydrated with water, clear liquids like broth, jello, juice, etc.

## 2016-02-08 NOTE — ED Provider Notes (Signed)
[facility] DEPT Provider Note   CSN: [id number] Arrival date & time: 02/08/16  0740     History   Chief Complaint Chief Complaint  Patient presents with  . Abdominal Pain    HPI Misty Figueroa is a 20 year old woman.  HPI  She presents with abdominal pain. She has had abdominal pain for the past 3 months. Initially intermittent but now it is constant for the last three weeks. Finished her antibiotics 1-2 weeks ago. Her appointment with GI is in 2 weeks.  Her abdominal pain is in the lower abdomen, worse on the right. Worsened by by eating. Previously was able to eat bland foods but now that exacerbates her pain as well. Sleeping and a heating pad makes it better. Similar to pain she had about a year ago when she had her gallbladder removed. No fevers or chills. She gets night sweats.   She has nausea and vomiting. She has emesis mostly at night. She has had 2 episodes in the last 24 hours. Sometimes occurs after meals. She can tolerate water. She had a few red specks in her emesis the day before yesterday. No coffee ground emesis.   She has intermittent diarrhea. Last had it over a week ago. Last BM was yesterday - formed. No melena. She has occasional blood upon wiping.   No sick contacts. She was seen in the ED on 10/5 for abdominal pain with rectal bleeding. CT abdomen/pelvis with findings suspicious for mild colitis of the descending colon. She was given Cipro/Flagyl. She was seen back in the ED on 10/9. Repeat CT scan demonstrated bowel wall thickening resolution. Bentyl and Miralax were added as there was concern for decreased motility.   She has a heart monitor for evaluation of palpitations.   Past Medical History:  Diagnosis Date  . Premature baby    born at 44 weeks    Past Surgical History:  Procedure Laterality Date  . CHOLECYSTECTOMY      OB History    No data available       Home Medications    Prior to Admission medications   Medication Sig Start  Date End Date Taking? Authorizing Provider  dicyclomine (BENTYL) 20 MG tablet Take 1 tablet (20 mg total) by mouth 4 (four) times daily -  before meals and at bedtime. 01/25/16   Gilda Crease, MD  fexofenadine (ALLEGRA) 180 MG tablet Take 180 mg by mouth daily.    Historical Provider, MD  HYDROcodone-acetaminophen (NORCO/VICODIN) 5-325 MG tablet Take 1 tablet by mouth every 6 (six) hours as needed for severe pain. 02/08/16   Lora Paula, MD  Norethindrone-Ethinyl Estradiol-Fe Biphas (LO LOESTRIN FE) 1 MG-10 MCG / 10 MCG tablet Take 1 tablet by mouth daily.    Historical Provider, MD  polyethylene glycol (MIRALAX / GLYCOLAX) packet Take 17 g by mouth daily. 01/25/16   Gilda Crease, MD  promethazine (PHENERGAN) 12.5 MG tablet Take 1 tablet (12.5 mg total) by mouth every 6 (six) hours as needed for nausea or vomiting. 02/08/16   Lora Paula, MD    Family History Family History  Problem Relation Age of Onset  . Hypertension Father   . Hypertension Maternal Grandmother   . Stroke Paternal Grandmother   . Diabetes Paternal Grandmother   . Pulmonary fibrosis Paternal Grandfather     Social History Social History  Substance Use Topics  . Smoking status: Never Smoker  . Smokeless tobacco: Never Used  . Alcohol use No  Allergies   Morphine and related; Penicillins; and Prochlorperazine   Review of Systems Review of Systems Constitutional: no fevers/chills Eyes: no vision changes Ears, nose, mouth, throat, and face: no cough Respiratory: no shortness of breath Cardiovascular: +occasional chest pain Gastrointestinal: see HPI Genitourinary: no dysuria, no hematuria Integument: +chronic intermittent urticaria Hematologic/lymphatic: no bleeding/bruising, no edema Musculoskeletal: +chronic arthralgias, no myalgias Neurological: no paresthesias, no weakness   Physical Exam Updated Vital Signs BP 104/63 (BP Location: Right Arm)   Pulse 88   Temp 97.8 F  (36.6 C) (Oral)   Resp 16   Ht 5\' 4"  (1.626 m)   Wt 41.3 kg   LMP 01/03/2016 (Exact Date)   SpO2 100%   BMI 15.62 kg/m   Physical Exam General Apperance: NAD [place]: Normocephalic, atraumatic Eyes: PERRL, EOMI, anicteric sclera Ears: Normal external ear canal Nose: Nares normal, septum midline, mucosa normal Throat: Lips, mucosa and tongue normal  Neck: Supple, trachea midline Back: No tenderness or bony abnormality  Lungs: Clear to auscultation bilaterally. No wheezes, rhonchi or rales. Breathing comfortably Chest Wall: Nontender, no deformity Heart: Regular rate and rhythm, no murmur/rub/gallop Abdomen: Soft, mild to moderately tender to palpation right side of abdomen, most in RLQ, mildly distended, no rebound/guarding Extremities: Normal, atraumatic, warm and well perfused, no edema Pulses: 2+ throughout Skin: No rashes or lesions Neurologic: Alert and oriented x 3. CNII-XII intact. Normal strength and sensation   ED Treatments / Results  Labs (all labs ordered are listed, but only abnormal results are displayed) Labs Reviewed  COMPREHENSIVE METABOLIC PANEL - Abnormal; Notable for the following:       Result Value   Calcium 8.8 (*)    All other components within normal limits  LIPASE, BLOOD  CBC  URINALYSIS, ROUTINE W REFLEX MICROSCOPIC (NOT AT [facility])  POC URINE PREG, ED    Procedures   Medications Ordered in ED Medications  HYDROcodone-acetaminophen (NORCO/VICODIN) 5-325 MG per tablet 1 tablet (not administered)  ketorolac (TORADOL) 30 MG/ML injection 15 mg (15 mg Intravenous Given 02/08/16 0826)     Initial Impression / Assessment and Plan / ED Course  I have reviewed the triage vital signs and the nursing notes.  Pertinent labs & imaging results that were available during my care of the patient were reviewed by me and considered in my medical decision making (see chart for details).  Clinical Course   20 year old woman previously had a cholecystectomy  presenting with abdominal pain for the last 3 months that has become more constant over the last three weeks. Previously diagnosed with colitis by CT scan. She received cipro and flagyl. Had resolution of colitis by repeat CT scan. Her pain has been worsening since she finished her antibiotics.  Final Clinical Impressions(s) / ED Diagnoses   Final diagnoses:  Lower abdominal pain  Abdomen is mildly tender to palpation in right side without rebound or guarding. CMP and CBC unremarkable. She is afebrile with no leukocytosis. UA and urine pregnancy negative. No evidence of appendicitis by CT scan earlier this month. Unlikely to be nephrolithiasis or UTI by UA findings. IBD, IBS remains on the differential. I called and moved her appointment with gastroenterology to this Wednesday. Will give her a short course of hydrocodone-acetaminophen 5-325mg  and Phenergan. Follow up with PCP as well.  New Prescriptions New Prescriptions   PROMETHAZINE (PHENERGAN) 12.5 MG TABLET    Take 1 tablet (12.5 mg total) by mouth every 6 (six) hours as needed for nausea or vomiting.  Lora Paula, MD 02/08/16 1610    Nelva Nay, MD 02/13/16 [id number]

## 2016-02-10 ENCOUNTER — Encounter: Payer: Self-pay | Admitting: Nurse Practitioner

## 2016-02-10 ENCOUNTER — Other Ambulatory Visit: Payer: Self-pay

## 2016-02-10 ENCOUNTER — Ambulatory Visit (INDEPENDENT_AMBULATORY_CARE_PROVIDER_SITE_OTHER): Payer: BC Managed Care – PPO | Admitting: Nurse Practitioner

## 2016-02-10 DIAGNOSIS — R634 Abnormal weight loss: Secondary | ICD-10-CM

## 2016-02-10 DIAGNOSIS — R1084 Generalized abdominal pain: Secondary | ICD-10-CM | POA: Diagnosis not present

## 2016-02-10 DIAGNOSIS — K625 Hemorrhage of anus and rectum: Secondary | ICD-10-CM

## 2016-02-10 DIAGNOSIS — K648 Other hemorrhoids: Secondary | ICD-10-CM | POA: Insufficient documentation

## 2016-02-10 DIAGNOSIS — M7918 Myalgia, other site: Secondary | ICD-10-CM | POA: Insufficient documentation

## 2016-02-10 DIAGNOSIS — R1033 Periumbilical pain: Secondary | ICD-10-CM

## 2016-02-10 DIAGNOSIS — R109 Unspecified abdominal pain: Secondary | ICD-10-CM

## 2016-02-10 MED ORDER — SOD PICOSULFATE-MAG OX-CIT ACD 10-3.5-12 MG-GM-GM PO PACK: 1.0000 | PACK | ORAL | 0 refills | Status: DC

## 2016-02-10 NOTE — Progress Notes (Addendum)
REVIEWED-NO ADDITIONAL RECOMMENDATIONS.  Primary Care Physician:  Hendricks LimesRAWFORD, BRANDON WAYNE, DO Primary Gastroenterologist:  Dr. Darrick PennaFields  Chief Complaint  Patient presents with  . Abdominal Pain    ER several times, had colitis but cont to have RUQ pain, abd distended at times,bleeding with BM, constipation at times  . Anorexia    lost 10 lbs past couple months    HPI:   Misty Figueroa is a 20 y.o. female who presents on referral from the emergency department for abdominal pain and weight loss. Patient was seen in the emergency department on 02/08/2016 with abdominal pain for the previous 3 months which had progressed from intermittent to constant. Recently finished antibiotics. Pain is in the lower abdomen, worse on the right and similar to the pain prior to her cholecystectomy. Also with nausea and vomiting and intermittent diarrhea. Noted occasional bright red blood per rectum. Was previously seen by the emergency department on 01/21/2016 for similar symptoms with a CT showing mild colitis treated with antibiotics and a follow-up visit on 10/9 with a repeat CT showing bowel wall thickening resolution.  Labs including CMP, lipase, CBC, urinalysis essentially normal. ER provider impression of mild tenderness with IBS and IBD remaining in the differential.  Today she is accompanied by her mother. Today she states she feels "terrible" overall. Abdominal pain and right-sided and periumbilical. Has had rectal bleeding for the past month. Denies melena. Continued nausea, last episode of emesis 2 days ago. Eating minimal amounts of food. Weight in the ER was 91 lb, weight today is 94 lb and states "I don't know how that is possible." Weight in the ER 1 month ago was 95 lb. Weight typically 98-99 pounds. Poor energy level, noted fatigue and malaise. Denies fever, chills. Is wearing a heart monitor for palpitations. Last bowel movement 2 days ago, tends to be constipated but did have diarrhea for a week  about 2 weeks ago. Denies dyspnea, dizziness, lightheadedness, syncope, near syncope. Denies any other upper or lower GI symptoms.  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 Premature baby    born at 4627 weeks    Past Surgical History:  Procedure Laterality Date  . CHOLECYSTECTOMY      Current Outpatient Prescriptions  Medication Sig Dispense Refill  . diphenhydrAMINE (BENADRYL) 25 mg capsule Take 25 mg by mouth every 6 (six) hours as needed. Takes for hives (autoimmune disorder)    . HYDROcodone-acetaminophen (NORCO/VICODIN) 5-325 MG tablet Take 1 tablet by mouth every 6 (six) hours as needed for severe pain. 10 tablet 0  . promethazine (PHENERGAN) 12.5 MG tablet Take 1 tablet (12.5 mg total) by mouth every 6 (six) hours as needed for nausea or vomiting. 15 tablet 0   No current facility-administered medications for this visit.     Allergies as of 02/10/2016 - Review Complete 02/10/2016  Allergen Reaction Noted  . Morphine and related  01/25/2016  . Penicillins Hives 01/07/2016  . Prochlorperazine  01/07/2016    Family History  Problem Relation Age of Onset  . Hypertension Father   . Hypertension Maternal Grandmother   . Stroke Paternal Grandmother   . Diabetes Paternal Grandmother   . Pulmonary fibrosis Paternal Grandfather   . Colon cancer Neg Hx   . Crohn's disease Neg Hx   . Inflammatory bowel disease Neg Hx     Social History  Social History  . Marital status: Single    Spouse name: N/A  . Number of children: N/A  . Years of education: N/A   Occupational History  . Not on file.   Social History Main Topics  . Smoking status: Never Smoker  . Smokeless tobacco: Never Used  . Alcohol use No  . Drug use: No  . Sexual activity: Not Currently   Other Topics Concern  . Not on file   Social History Narrative  . No narrative on file      Review of Systems: Complete ROS negative except as per HPI.    Physical Exam: BP 102/74   Pulse 86   Temp 98 F (36.7 C) (Oral)   Ht 5\' 4"  (1.626 m)   Wt 94 lb (42.6 kg)   LMP 01/03/2016 (Exact Date)   BMI 16.14 kg/m  General:   Alert and oriented. Pleasant and cooperative. Well-nourished and well-developed.  Mccabe:  Normocephalic and atraumatic. Eyes:  Without icterus, sclera clear and conjunctiva pink.  Ears:  Normal auditory acuity. Cardiovascular:  S1, S2 present without murmurs appreciated. Extremities without clubbing or edema. Respiratory:  Clear to auscultation bilaterally. No wheezes, rales, or rhonchi. No distress.  Gastrointestinal:  +BS, soft, and non-distended. Mild TTP to palpation. No HSM noted. No guarding or rebound. No masses appreciated.  Rectal:  Deferred  Musculoskalatal:  Symmetrical without gross deformities. Neurologic:  Alert and oriented x4;  grossly normal neurologically. Psych:  Alert and cooperative. Normal mood and affect. Heme/Lymph/Immune: No excessive bruising noted.    02/10/2016 9:47 AM   Disclaimer: This note was dictated with voice recognition software. Similar sounding words can inadvertently be transcribed and may not be corrected upon review.

## 2016-02-10 NOTE — Assessment & Plan Note (Signed)
She has had fluctuating weight. One month ago as the easiest weight we can find in our system at which point she weighed approximately 95 pounds. A couple days ago in emergency room she weighed 91 pounds, today on our scale she weighs 94 pounds. Typically 98-99 pounds, per the patient and her mother. Potential 8-10 pound weight loss in a relatively short amount of time and it patient to is generally small in stature and weight at baseline. We will proceed with upper endoscopy and colonoscopy as noted below. Return for follow-up in 6-8 weeks.

## 2016-02-10 NOTE — Progress Notes (Signed)
CC'ED TO PCP 

## 2016-02-10 NOTE — Assessment & Plan Note (Signed)
Plates of generalized abdominal pain, worsening right lower quadrant and periumbilical/epigastric areas. CT in the emergency department a couple ER visits ago showed colitis for which she was treated with antibiotics and interval resolution of wall thickening on repeat CT. She does continue to have abdominal pain however. At this point giving her abdominal pain and other associated symptoms as noted above and an history of present illness we will proceed with colonoscopy and endoscopy. Return for follow-up in 6-8 weeks.  Proceed with colonoscopy and EGD on propofol/MAC with Dr. Darrick PennaFields in the near future. The risks, benefits, and alternatives have been discussed in detail with the patient. They state understanding and desire to proceed.   The patient will be on propofol/MAC due to polypharmacy and anxiety.

## 2016-02-10 NOTE — Assessment & Plan Note (Signed)
Noted rectal bleeding as of the past month. She does have intermittent constipation, but couple weeks ago had significant diarrhea as well. Likely benign anorectal source, but given her other constellation of symptoms cannot rule out more insidious pathology such his bleeding polyp, colorectal cancer, other GI malignancy, inflammatory bowel disease, and others. We'll proceed with upper and lower colonoscopy as described below. Return for follow-up in 6-8 weeks.

## 2016-02-10 NOTE — Patient Instructions (Signed)
1. We will schedule your procedures for you. 2. Return for follow-up in 6-8 weeks.

## 2016-02-16 ENCOUNTER — Telehealth: Payer: Self-pay

## 2016-02-16 ENCOUNTER — Ambulatory Visit (HOSPITAL_COMMUNITY)
Admission: RE | Admit: 2016-02-16 | Discharge: 2016-02-16 | Disposition: A | Discharge status: Home / Self Care | Payer: BC Managed Care – PPO | Source: Ambulatory Visit | Attending: Cardiovascular Disease | Admitting: Cardiovascular Disease

## 2016-02-16 ENCOUNTER — Telehealth: Payer: Self-pay | Admitting: Gastroenterology

## 2016-02-16 DIAGNOSIS — R002 Palpitations: Secondary | ICD-10-CM | POA: Diagnosis present

## 2016-02-16 DIAGNOSIS — I081 Rheumatic disorders of both mitral and tricuspid valves: Secondary | ICD-10-CM | POA: Insufficient documentation

## 2016-02-16 LAB — ECHOCARDIOGRAM COMPLETE
E decel time: 183 msec
E/e' ratio: 5.27
FS: 38 % (ref 28–44)
IVS/LV PW RATIO, ED: 1.23
LA ID, A-P, ES: 32 mm
LA diam end sys: 32 mm
LA diam index: 2.33 cm/m2
LA vol A4C: 16.9 ml
LA vol index: 14.3 mL/m2
LA vol: 19.7 mL
LV E/e' medial: 5.27
LV E/e'average: 5.27
LV PW d: 6.05 mm — AB (ref 0.6–1.1)
LV dias vol index: 27 mL/m2
LV dias vol: 38 mL — AB (ref 46–106)
LV e' LATERAL: 16.1 cm/s
LV sys vol index: 10 mL/m2
LV sys vol: 14 mL (ref 14–42)
LVOT SV: 34 mL
LVOT VTI: 19.2 cm
LVOT area: 1.77 cm2
LVOT diameter: 15 mm
LVOT peak grad rest: 3 mmHg
LVOT peak vel: 89.4 cm/s
Lateral S' vel: 10 cm/s
MV Dec: 183
MV Peak grad: 3 mmHg
MV pk A vel: 43.7 m/s
MV pk E vel: 84.8 m/s
Simpson's disk: 64
Stroke v: 24 ml
TAPSE: 13 mm
TDI e' lateral: 16.1
TDI e' medial: 12.9

## 2016-02-16 MED ORDER — PANTOPRAZOLE SODIUM 40 MG PO TBEC: 40.0000 mg | DELAYED_RELEASE_TABLET | Freq: Every day | ORAL | 3 refills | Status: DC

## 2016-02-16 NOTE — Progress Notes (Signed)
*  PRELIMINARY RESULTS* Echocardiogram 2D Echocardiogram has been performed.  Stacey DrainWhite, Josey Forcier J 02/16/2016, 1:38 PM

## 2016-02-16 NOTE — Telephone Encounter (Signed)
Pt called back and I informed her of all. She asked if she could get a prescription for some pain medication. I told her that we do not give narcotics for chronic abdominal pain.  She said her stomach was hurting so badly that she felt like crying. When asked on scale of 1-10 how bad, she said about a 7 ( the same she said this AM). Then she said that it fluctuates from a 7 to a 9.  I told her that if her pain became so bad that she could not tolerate it that she should go to the ED and she said OK. She is aware I will address with Wynne DustEric Gill, NP tomorrow morning.

## 2016-02-16 NOTE — Telephone Encounter (Signed)
Pt called and said she is having worsening abdominal pain on the right side in the mid area. She describes it as being sharp pains at times. She has been very nauseated at times and unable to eat anything except crackers or bread. She is tolerating liquids and getting plenty of them. She describes her pain as at a 7 now. Forwarding to Wynne DustEric Gill, NP who saw her in the office. Please advise!

## 2016-02-16 NOTE — Telephone Encounter (Signed)
See first note this AM, I have routed to Lewie LoronAnna Boone, NP since Minerva Areolaric had to leave early today.

## 2016-02-16 NOTE — Telephone Encounter (Signed)
I am not familiar with patient. After review of last note, imaging, labs, will start her on Protonix once daily. I don't see where she is on a PPI. Already arranging TCS/EGD. [facility]. Please address with him first thing tomorrow. I sent in Protonix.

## 2016-02-16 NOTE — Telephone Encounter (Signed)
I called home number, could not reach pt. I called the other number, bad connection. She is going to try to call me back.

## 2016-02-16 NOTE — Telephone Encounter (Signed)
Patient called again wanting to hear what she can do. She is still really hurting

## 2016-02-16 NOTE — Telephone Encounter (Signed)
Misty Figueroa, this pt called back, can you address since Minerva Areolaric had to leave early today?

## 2016-02-17 NOTE — Telephone Encounter (Signed)
Pt is aware. She said she feels about the same but she has not gotten the Protonix yet. She will try to get it and see how it works. She is aware to go to the ED if symptoms worsen.

## 2016-02-17 NOTE — Telephone Encounter (Signed)
No further recommendations. She was recently evaluated in the ED, labs and imaging appear essentially normal. Start Protonix as recommended by Tobi BastosAnna. If severe symptoms can proceed tot he ER.

## 2016-02-18 ENCOUNTER — Ambulatory Visit: Payer: BC Managed Care – PPO | Admitting: Gastroenterology

## 2016-03-02 ENCOUNTER — Telehealth: Payer: Self-pay

## 2016-03-02 NOTE — Telephone Encounter (Signed)
Left pt a message to return call.

## 2016-03-02 NOTE — Telephone Encounter (Signed)
-----   Message from Wendall StadePeter C Nishan, MD sent at 03/02/2016 12:09 PM EST ----- No arrhythmias benign

## 2016-03-02 NOTE — Patient Instructions (Signed)
Misty Figueroa  03/02/2016     @PREFPERIOPPHARMACY @   Your procedure is scheduled on  03/08/2016   Report to [facility] at  1045   A.M.  Call this number if you have problems the morning of surgery:  [phone]   Remember:  Do not eat food or drink liquids after midnight.  Take these medicines the morning of surgery with A SIP OF WATER  Benadryl, protonix.   Do not wear jewelry, make-up or nail polish.  Do not wear lotions, powders, or perfumes, or deoderant.  Do not shave 48 hours prior to surgery.  Men may shave face and neck.  Do not bring valuables to the hospital.  [facility] is not responsible for any belongings or valuables.  Contacts, dentures or bridgework may not be worn into surgery.  Leave your suitcase in the car.  After surgery it may be brought to your room.  For patients admitted to the hospital, discharge time will be determined by your treatment team.  Patients discharged the day of surgery will not be allowed to drive home.   Name and phone number of your driver:   family Special instructions:  Follow the diet and prep instructions given to you by Dr Evelina Dun office.  Please read over the following fact sheets that you were given. Anesthesia Post-op Instructions and Care and Recovery After Surgery       Esophagogastroduodenoscopy Introduction Esophagogastroduodenoscopy (EGD) is a procedure to examine the lining of the esophagus, stomach, and first part of the small intestine (duodenum). This procedure is done to check for problems such as inflammation, bleeding, ulcers, or growths. During this procedure, a long, flexible, lighted tube with a camera attached (endoscope) is inserted down the throat. Tell a health care provider about:  Any allergies you have.  All medicines you are taking, including vitamins, herbs, eye drops, creams, and over-the-counter medicines.  Any problems you or family members have had with anesthetic  medicines.  Any blood disorders you have.  Any surgeries you have had.  Any medical conditions you have.  Whether you are pregnant or may be pregnant. What are the risks? Generally, this is a safe procedure. However, problems may occur, including:  Infection.  Bleeding.  A tear (perforation) in the esophagus, stomach, or duodenum.  Trouble breathing.  Excessive sweating.  Spasms of the larynx.  A slowed heartbeat.  Low blood pressure. What happens before the procedure?  Follow instructions from your health care provider about eating or drinking restrictions.  Ask your health care provider about:  Changing or stopping your regular medicines. This is especially important if you are taking diabetes medicines or blood thinners.  Taking medicines such as aspirin and ibuprofen. These medicines can thin your blood. Do not take these medicines before your procedure if your health care provider instructs you not to.  Plan to have someone take you home after the procedure.  If you wear dentures, be ready to remove them before the procedure. What happens during the procedure?  To reduce your risk of infection, your health care team will wash or sanitize their hands.  An IV tube will be put in a vein in your hand or arm. You will get medicines and fluids through this tube.  You will be given one or more of the following:  A medicine to help you relax (sedative).  A medicine to numb the area (local anesthetic). This  medicine may be sprayed into your throat. It will make you feel more comfortable and keep you from gagging or coughing during the procedure.  A medicine for pain.  A mouth guard may be placed in your mouth to protect your teeth and to keep you from biting on the endoscope.  You will be asked to lie on your left side.  The endoscope will be lowered down your throat into your esophagus, stomach, and duodenum.  Air will be put into the endoscope. This will help  your health care provider see better.  The lining of your esophagus, stomach, and duodenum will be examined.  Your health care provider may:  Take a tissue sample so it can be looked at in a lab (biopsy).  Remove growths.  Remove objects (foreign bodies) that are stuck.  Treat any bleeding with medicines or other devices that stop tissue from bleeding.  Widen (dilate) or stretch narrowed areas of your esophagus and stomach.  The endoscope will be taken out. The procedure may vary among health care providers and hospitals. What happens after the procedure?  Your blood pressure, heart rate, breathing rate, and blood oxygen level will be monitored often until the medicines you were given have worn off.  Do not eat or drink anything until the numbing medicine has worn off and your gag reflex has returned. This information is not intended to replace advice given to you by your health care provider. Make sure you discuss any questions you have with your health care provider. Document Released: 08/05/2004 Document Revised: 09/10/2015 Document Reviewed: 02/26/2015  2017 Elsevier Esophagogastroduodenoscopy, Care After Introduction Refer to this sheet in the next few weeks. These instructions provide you with information about caring for yourself after your procedure. Your health care provider may also give you more specific instructions. Your treatment has been planned according to current medical practices, but problems sometimes occur. Call your health care provider if you have any problems or questions after your procedure. What can I expect after the procedure? After the procedure, it is common to have:  A sore throat.  Nausea.  Bloating.  Dizziness.  Fatigue. Follow these instructions at home:  Do not eat or drink anything until the numbing medicine (local anesthetic) has worn off and your gag reflex has returned. You will know that the local anesthetic has worn off when you  can swallow comfortably.  Do not drive for 24 hours if you received a medicine to help you relax (sedative).  If your health care provider took a tissue sample for testing during the procedure, make sure to get your test results. This is your responsibility. Ask your health care provider or the department performing the test when your results will be ready.  Keep all follow-up visits as told by your health care provider. This is important. Contact a health care provider if:  You cannot stop coughing.  You are not urinating.  You are urinating less than usual. Get help right away if:  You have trouble swallowing.  You cannot eat or drink.  You have throat or chest pain that gets worse.  You are dizzy or light-headed.  You faint.  You have nausea or vomiting.  You have chills.  You have a fever.  You have severe abdominal pain.  You have black, tarry, or bloody stools. This information is not intended to replace advice given to you by your health care provider. Make sure you discuss any questions you have with your health care  provider. Document Released: 03/21/2012 Document Revised: 09/10/2015 Document Reviewed: 02/26/2015  2017 Elsevier  Colonoscopy, Adult A colonoscopy is an exam to look at the entire large intestine. During the exam, a lubricated, bendable tube is inserted into the anus and then passed into the rectum, colon, and other parts of the large intestine. A colonoscopy is often done as a part of normal colorectal screening or in response to certain symptoms, such as anemia, persistent diarrhea, abdominal pain, and blood in the stool. The exam can help screen for and diagnose medical problems, including:  Tumors.  Polyps.  Inflammation.  Areas of bleeding. Tell a health care provider about:  Any allergies you have.  All medicines you are taking, including vitamins, herbs, eye drops, creams, and over-the-counter medicines.  Any problems you or family  members have had with anesthetic medicines.  Any blood disorders you have.  Any surgeries you have had.  Any medical conditions you have.  Any problems you have had passing stool. What are the risks? Generally, this is a safe procedure. However, problems may occur, including:  Bleeding.  A tear in the intestine.  A reaction to medicines given during the exam.  Infection (rare). What happens before the procedure? Eating and drinking restrictions  Follow instructions from your health care provider about eating and drinking, which may include:  A few days before the procedure - follow a low-fiber diet. Avoid nuts, seeds, dried fruit, raw fruits, and vegetables.  1-3 days before the procedure - follow a clear liquid diet. Drink only clear liquids, such as clear broth or bouillon, black coffee or tea, clear juice, clear soft drinks or sports drinks, gelatin desert, and popsicles. Avoid any liquids that contain red or purple dye.  On the day of the procedure - do not eat or drink anything during the 2 hours before the procedure, or within the time period that your health care provider recommends. Bowel prep  If you were prescribed an oral bowel prep to clean out your colon:  Take it as told by your health care provider. Starting the day before your procedure, you will need to drink a large amount of medicated liquid. The liquid will cause you to have multiple loose stools until your stool is almost clear or light green.  If your skin or anus gets irritated from diarrhea, you may use these to relieve the irritation:  Medicated wipes, such as adult wet wipes with aloe and vitamin E.  A skin soothing-product like petroleum jelly.  If you vomit while drinking the bowel prep, take a break for up to 60 minutes and then begin the bowel prep again. If vomiting continues and you cannot take the bowel prep without vomiting, call your health care provider. General instructions  Ask your  health care provider about changing or stopping your regular medicines. This is especially important if you are taking diabetes medicines or blood thinners.  Plan to have someone take you home from the hospital or clinic. What happens during the procedure?  An IV tube may be inserted into one of your veins.  You will be given medicine to help you relax (sedative).  To reduce your risk of infection:  Your health care team will wash or sanitize their hands.  Your anal area will be washed with soap.  You will be asked to lie on your side with your knees bent.  Your health care provider will lubricate a long, thin, flexible tube. The tube will have a camera and  a light on the end.  The tube will be inserted into your anus.  The tube will be gently eased through your rectum and colon.  Air will be delivered into your colon to keep it open. You may feel some pressure or cramping.  The camera will be used to take images during the procedure.  A small tissue sample may be removed from your body to be examined under a microscope (biopsy). If any potential problems are found, the tissue will be sent to a lab for testing.  If small polyps are found, your health care provider may remove them and have them checked for cancer cells.  The tube that was inserted into your anus will be slowly removed. The procedure may vary among health care providers and hospitals. What happens after the procedure?  Your blood pressure, heart rate, breathing rate, and blood oxygen level will be monitored until the medicines you were given have worn off.  Do not drive for 24 hours after the exam.  You may have a small amount of blood in your stool.  You may pass gas and have mild abdominal cramping or bloating due to the air that was used to inflate your colon during the exam.  It is up to you to get the results of your procedure. Ask your health care provider, or the department performing the procedure,  when your results will be ready. This information is not intended to replace advice given to you by your health care provider. Make sure you discuss any questions you have with your health care provider. Document Released: 04/01/2000 Document Revised: 10/23/2015 Document Reviewed: 06/16/2015 Elsevier Interactive Patient Education  2017 [employer]  Colonoscopy, Adult, Care After This sheet gives you information about how to care for yourself after your procedure. Your health care provider may also give you more specific instructions. If you have problems or questions, contact your health care provider. What can I expect after the procedure? After the procedure, it is common to have:  A small amount of blood in your stool for 24 hours after the procedure.  Some gas.  Mild abdominal cramping or bloating. Follow these instructions at home: General instructions  For the first 24 hours after the procedure:  Do not drive or use machinery.  Do not sign important documents.  Do not drink alcohol.  Do your regular daily activities at a slower pace than normal.  Eat soft, easy-to-digest foods.  Rest often.  Take over-the-counter or prescription medicines only as told by your health care provider.  It is up to you to get the results of your procedure. Ask your health care provider, or the department performing the procedure, when your results will be ready. Relieving cramping and bloating  Try walking around when you have cramps or feel bloated.  Apply heat to your abdomen as told by your health care provider. Use a heat source that your health care provider recommends, such as a moist heat pack or a heating pad.  Place a towel between your skin and the heat source.  Leave the heat on for 20-30 minutes.  Remove the heat if your skin turns bright red. This is especially important if you are unable to feel pain, heat, or cold. You may have a greater risk of getting burned. Eating  and drinking  Drink enough fluid to keep your urine clear or pale yellow.  Resume your normal diet as instructed by your health care provider. Avoid heavy or fried foods that  are hard to digest.  Avoid drinking alcohol for as long as instructed by your health care provider. Contact a health care provider if:  You have blood in your stool 2-3 days after the procedure. Get help right away if:  You have more than a small spotting of blood in your stool.  You pass large blood clots in your stool.  Your abdomen is swollen.  You have nausea or vomiting.  You have a fever.  You have increasing abdominal pain that is not relieved with medicine. This information is not intended to replace advice given to you by your health care provider. Make sure you discuss any questions you have with your health care provider. Document Released: 11/17/2003 Document Revised: 12/28/2015 Document Reviewed: 06/16/2015 Elsevier Interactive Patient Education  2017 [employer]  Monitored Anesthesia Care Anesthesia is a term that refers to techniques, procedures, and medicines that help a person stay safe and comfortable during a medical procedure. Monitored anesthesia care, or sedation, is one type of anesthesia. Your anesthesia specialist may recommend sedation if you will be having a procedure that does not require you to be unconscious, such as:  Cataract surgery.  A dental procedure.  A biopsy.  A colonoscopy. During the procedure, you may receive a medicine to help you relax (sedative). There are three levels of sedation:  Mild sedation. At this level, you may feel awake and relaxed. You will be able to follow directions.  Moderate sedation. At this level, you will be sleepy. You may not remember the procedure.  Deep sedation. At this level, you will be asleep. You will not remember the procedure. The more medicine you are given, the deeper your level of sedation will be. Depending on how you  respond to the procedure, the anesthesia specialist may change your level of sedation or the type of anesthesia to fit your needs. An anesthesia specialist will monitor you closely during the procedure. Let your health care provider know about:  Any allergies you have.  All medicines you are taking, including vitamins, herbs, eye drops, creams, and over-the-counter medicines.  Any use of steroids (by mouth or as a cream).  Any problems you or family members have had with sedatives and anesthetic medicines.  Any blood disorders you have.  Any surgeries you have had.  Any medical conditions you have, such as sleep apnea.  Whether you are pregnant or may be pregnant.  Any use of cigarettes, alcohol, or street drugs. What are the risks? Generally, this is a safe procedure. However, problems may occur, including:  Getting too much medicine (oversedation).  Nausea.  Allergic reaction to medicines.  Trouble breathing. If this happens, a breathing tube may be used to help with breathing. It will be removed when you are awake and breathing on your own.  Heart trouble.  Lung trouble. Before the procedure Staying hydrated  Follow instructions from your health care provider about hydration, which may include:  Up to 2 hours before the procedure - you may continue to drink clear liquids, such as water, clear fruit juice, black coffee, and plain tea. Eating and drinking restrictions  Follow instructions from your health care provider about eating and drinking, which may include:  8 hours before the procedure - stop eating heavy meals or foods such as meat, fried foods, or fatty foods.  6 hours before the procedure - stop eating light meals or foods, such as toast or cereal.  6 hours before the procedure - stop drinking milk or  drinks that contain milk.  2 hours before the procedure - stop drinking clear liquids. Medicines  Ask your health care provider about:  Changing or  stopping your regular medicines. This is especially important if you are taking diabetes medicines or blood thinners.  Taking medicines such as aspirin and ibuprofen. These medicines can thin your blood. Do not take these medicines before your procedure if your health care provider instructs you not to. Tests and exams  You will have a physical exam.  You may have blood tests done to show:  How well your kidneys and liver are working.  How well your blood can clot.  General instructions  Plan to have someone take you home from the hospital or clinic.  If you will be going home right after the procedure, plan to have someone with you for 24 hours. What happens during the procedure?  Your blood pressure, heart rate, breathing, level of pain and overall condition will be monitored.  An IV tube will be inserted into one of your veins.  Your anesthesia specialist will give you medicines as needed to keep you comfortable during the procedure. This may mean changing the level of sedation.  The procedure will be performed. After the procedure  Your blood pressure, heart rate, breathing rate, and blood oxygen level will be monitored until the medicines you were given have worn off.  Do not drive for 24 hours if you received a sedative.  You may:  Feel sleepy, clumsy, or nauseous.  Feel forgetful about what happened after the procedure.  Have a sore throat if you had a breathing tube during the procedure.  Vomit. This information is not intended to replace advice given to you by your health care provider. Make sure you discuss any questions you have with your health care provider. Document Released: 12/29/2004 Document Revised: 09/11/2015 Document Reviewed: 07/26/2015 Elsevier Interactive Patient Education  2017 [employer] PATIENT INSTRUCTIONS POST-ANESTHESIA  IMMEDIATELY FOLLOWING SURGERY:  Do not drive or operate machinery for the first twenty four hours after surgery.  Do  not make any important decisions for twenty four hours after surgery or while taking narcotic pain medications or sedatives.  If you develop intractable nausea and vomiting or a severe headache please notify your doctor immediately.  FOLLOW-UP:  Please make an appointment with your surgeon as instructed. You do not need to follow up with anesthesia unless specifically instructed to do so.  WOUND CARE INSTRUCTIONS (if applicable):  Keep a dry clean dressing on the anesthesia/puncture wound site if there is drainage.  Once the wound has quit draining you may leave it open to air.  Generally you should leave the bandage intact for twenty four hours unless there is drainage.  If the epidural site drains for more than 36-48 hours please call the anesthesia department.  QUESTIONS?:  Please feel free to call your physician or the hospital operator if you have any questions, and they will be happy to assist you.

## 2016-03-03 ENCOUNTER — Encounter (HOSPITAL_COMMUNITY)
Admission: RE | Admit: 2016-03-03 | Discharge: 2016-03-03 | Disposition: A | Discharge status: Home / Self Care | Payer: BC Managed Care – PPO | Source: Ambulatory Visit | Attending: Gastroenterology | Admitting: Gastroenterology

## 2016-03-03 ENCOUNTER — Encounter (HOSPITAL_COMMUNITY): Payer: Self-pay

## 2016-03-03 DIAGNOSIS — Z01812 Encounter for preprocedural laboratory examination: Secondary | ICD-10-CM | POA: Diagnosis not present

## 2016-03-03 DIAGNOSIS — R634 Abnormal weight loss: Secondary | ICD-10-CM | POA: Diagnosis not present

## 2016-03-03 DIAGNOSIS — K625 Hemorrhage of anus and rectum: Secondary | ICD-10-CM | POA: Insufficient documentation

## 2016-03-03 DIAGNOSIS — R109 Unspecified abdominal pain: Secondary | ICD-10-CM | POA: Insufficient documentation

## 2016-03-03 HISTORY — DX: Gastro-esophageal reflux disease without esophagitis: K21.9

## 2016-03-03 HISTORY — DX: Unspecified abdominal pain: R10.9

## 2016-03-03 LAB — CBC WITH DIFFERENTIAL/PLATELET
BASOS PCT: 1 %
Basophils Absolute: 0 10*3/uL (ref 0.0–0.1)
Eosinophils Absolute: 0.1 10*3/uL (ref 0.0–0.7)
Eosinophils Relative: 2 %
HEMATOCRIT: 39.1 % (ref 36.0–46.0)
HEMOGLOBIN: 13.4 g/dL (ref 12.0–15.0)
LYMPHS ABS: 2.5 10*3/uL (ref 0.7–4.0)
LYMPHS PCT: 35 %
MCH: 30.2 pg (ref 26.0–34.0)
MCHC: 34.3 g/dL (ref 30.0–36.0)
MCV: 88.1 fL (ref 78.0–100.0)
MONOS PCT: 6 %
Monocytes Absolute: 0.4 10*3/uL (ref 0.1–1.0)
NEUTROS ABS: 4.1 10*3/uL (ref 1.7–7.7)
NEUTROS PCT: 56 %
Platelets: 264 10*3/uL (ref 150–400)
RBC: 4.44 MIL/uL (ref 3.87–5.11)
RDW: 13.1 % (ref 11.5–15.5)
WBC: 7.2 10*3/uL (ref 4.0–10.5)

## 2016-03-03 LAB — HCG, SERUM, QUALITATIVE: Preg, Serum: NEGATIVE

## 2016-03-03 LAB — BASIC METABOLIC PANEL
Anion gap: 8 (ref 5–15)
BUN: 12 mg/dL (ref 6–20)
CHLORIDE: 103 mmol/L (ref 101–111)
CO2: 26 mmol/L (ref 22–32)
CREATININE: 0.55 mg/dL (ref 0.44–1.00)
Calcium: 9.2 mg/dL (ref 8.9–10.3)
GFR calc non Af Amer: >60 mL/min (ref >60)
GLUCOSE: 98 mg/dL (ref 65–99)
Potassium: 3.9 mmol/L (ref 3.5–5.1)
Sodium: 137 mmol/L (ref 135–145)

## 2016-03-08 ENCOUNTER — Encounter (HOSPITAL_COMMUNITY): Payer: Self-pay | Admitting: *Deleted

## 2016-03-08 ENCOUNTER — Ambulatory Visit (HOSPITAL_COMMUNITY)
Admission: RE | Admit: 2016-03-08 | Discharge: 2016-03-08 | Disposition: A | Discharge status: Home / Self Care | Payer: BC Managed Care – PPO | Source: Ambulatory Visit | Attending: Gastroenterology | Admitting: Gastroenterology

## 2016-03-08 ENCOUNTER — Encounter (HOSPITAL_COMMUNITY)
Admission: RE | Disposition: A | Discharge status: Home / Self Care | Payer: Self-pay | Source: Ambulatory Visit | Attending: Gastroenterology

## 2016-03-08 ENCOUNTER — Ambulatory Visit (HOSPITAL_COMMUNITY): Payer: BC Managed Care – PPO | Admitting: Anesthesiology

## 2016-03-08 DIAGNOSIS — K2950 Unspecified chronic gastritis without bleeding: Secondary | ICD-10-CM | POA: Diagnosis not present

## 2016-03-08 DIAGNOSIS — K2970 Gastritis, unspecified, without bleeding: Secondary | ICD-10-CM

## 2016-03-08 DIAGNOSIS — K219 Gastro-esophageal reflux disease without esophagitis: Secondary | ICD-10-CM | POA: Insufficient documentation

## 2016-03-08 DIAGNOSIS — K625 Hemorrhage of anus and rectum: Secondary | ICD-10-CM

## 2016-03-08 DIAGNOSIS — K921 Melena: Secondary | ICD-10-CM

## 2016-03-08 DIAGNOSIS — K648 Other hemorrhoids: Secondary | ICD-10-CM | POA: Diagnosis not present

## 2016-03-08 DIAGNOSIS — R1013 Epigastric pain: Secondary | ICD-10-CM | POA: Diagnosis not present

## 2016-03-08 DIAGNOSIS — Q438 Other specified congenital malformations of intestine: Secondary | ICD-10-CM | POA: Diagnosis not present

## 2016-03-08 DIAGNOSIS — R634 Abnormal weight loss: Secondary | ICD-10-CM

## 2016-03-08 DIAGNOSIS — R109 Unspecified abdominal pain: Secondary | ICD-10-CM

## 2016-03-08 DIAGNOSIS — R197 Diarrhea, unspecified: Secondary | ICD-10-CM | POA: Insufficient documentation

## 2016-03-08 DIAGNOSIS — R1033 Periumbilical pain: Secondary | ICD-10-CM | POA: Diagnosis present

## 2016-03-08 HISTORY — PX: COLONOSCOPY WITH PROPOFOL: SHX5780

## 2016-03-08 HISTORY — PX: ESOPHAGOGASTRODUODENOSCOPY (EGD) WITH PROPOFOL: SHX5813

## 2016-03-08 SURGERY — COLONOSCOPY WITH PROPOFOL
Anesthesia: Monitor Anesthesia Care

## 2016-03-08 MED ORDER — PHENYLEPHRINE 40 MCG/ML (10ML) SYRINGE FOR IV PUSH (FOR BLOOD PRESSURE SUPPORT)
PREFILLED_SYRINGE | INTRAVENOUS | Status: AC
  Filled 2016-03-08: qty 10

## 2016-03-08 MED ORDER — PAROXETINE HCL 10 MG PO TABS: ORAL_TABLET | ORAL | 11 refills | Status: DC

## 2016-03-08 MED ORDER — MIDAZOLAM HCL 2 MG/2ML IJ SOLN
INTRAMUSCULAR | Status: AC
  Filled 2016-03-08: qty 2

## 2016-03-08 MED ORDER — ONDANSETRON HCL 4 MG/2ML IJ SOLN
4.0000 mg | Freq: Once | INTRAMUSCULAR | Status: AC
  Administered 2016-03-08: 4 mg via INTRAVENOUS

## 2016-03-08 MED ORDER — PROPOFOL 500 MG/50ML IV EMUL
INTRAVENOUS | Status: DC | PRN
  Administered 2016-03-08: 125 ug/kg/min via INTRAVENOUS

## 2016-03-08 MED ORDER — LIDOCAINE VISCOUS 2 % MT SOLN
5.0000 mL | Freq: Two times a day (BID) | OROMUCOSAL | Status: DC
  Administered 2016-03-08: 5 mL via OROMUCOSAL

## 2016-03-08 MED ORDER — PROPOFOL 500 MG/50ML IV EMUL: INTRAVENOUS | Status: DC | PRN

## 2016-03-08 MED ORDER — CHLORHEXIDINE GLUCONATE CLOTH 2 % EX PADS: 6.0000 | MEDICATED_PAD | Freq: Once | CUTANEOUS | Status: DC

## 2016-03-08 MED ORDER — ONDANSETRON HCL 4 MG/2ML IJ SOLN
INTRAMUSCULAR | Status: AC
  Filled 2016-03-08: qty 2

## 2016-03-08 MED ORDER — LIDOCAINE VISCOUS 2 % MT SOLN
OROMUCOSAL | Status: AC
  Filled 2016-03-08: qty 15

## 2016-03-08 MED ORDER — FENTANYL CITRATE (PF) 100 MCG/2ML IJ SOLN: 25.0000 ug | INTRAMUSCULAR | Status: DC | PRN

## 2016-03-08 MED ORDER — PROPOFOL 10 MG/ML IV BOLUS
INTRAVENOUS | Status: AC
  Filled 2016-03-08: qty 20

## 2016-03-08 MED ORDER — MIDAZOLAM HCL 2 MG/2ML IJ SOLN
1.0000 mg | INTRAMUSCULAR | Status: DC | PRN
  Administered 2016-03-08: 2 mg via INTRAVENOUS

## 2016-03-08 MED ORDER — FENTANYL CITRATE (PF) 100 MCG/2ML IJ SOLN
INTRAMUSCULAR | Status: AC
  Filled 2016-03-08: qty 2

## 2016-03-08 MED ORDER — FENTANYL CITRATE (PF) 100 MCG/2ML IJ SOLN
25.0000 ug | Freq: Once | INTRAMUSCULAR | Status: AC
  Administered 2016-03-08: 25 ug via INTRAVENOUS

## 2016-03-08 MED ORDER — PANTOPRAZOLE SODIUM 40 MG PO TBEC: DELAYED_RELEASE_TABLET | ORAL | 11 refills | Status: DC

## 2016-03-08 MED ORDER — PROPOFOL 10 MG/ML IV BOLUS
INTRAVENOUS | Status: DC | PRN
  Administered 2016-03-08: 20 mg via INTRAVENOUS
  Administered 2016-03-08: 10 mg via INTRAVENOUS

## 2016-03-08 MED ORDER — LACTATED RINGERS IV SOLN
INTRAVENOUS | Status: DC
  Administered 2016-03-08: 1000 mL via INTRAVENOUS

## 2016-03-08 NOTE — Op Note (Signed)
[facility] Patient Name: Misty Figueroa Procedure Date: 03/08/2016 11:54 AM MRN: [id number] Date of Birth: 16-May-1995 Attending MD: Jonette Eva , MD CSN: [id number] Age: 20 Admit Type: Outpatient Procedure:                Colonoscopy WITH COLD FORCEPS BIOPSY Indications:              Periumbilical abdominal pain, Clinically                            significant diarrhea of unexplained origin,                            Hematochezia Providers:                Jonette Eva, MD, Loma Messing B. Patsy Lager, RN, Toniann Fail RN, RN, Birder Robson, Technician Referring MD:             Jennette Banker CRAWFORD, MD Medicines:                Propofol per Anesthesia Complications:            No immediate complications. Estimated Blood Loss:     Estimated blood loss was minimal. Procedure:                Pre-Anesthesia Assessment:                           - Prior to the procedure, a History and Physical                            was performed, and patient medications and                            allergies were reviewed. The patient's tolerance of                            previous anesthesia was also reviewed. The risks                            and benefits of the procedure and the sedation                            options and risks were discussed with the patient.                            All questions were answered, and informed consent                            was obtained. Prior Anticoagulants: The patient has                            taken aspirin. ASA Grade Assessment: II - A patient  with mild systemic disease. After reviewing the                            risks and benefits, the patient was deemed in                            satisfactory condition to undergo the procedure.                            After obtaining informed consent, the colonoscope                            was passed under direct vision.  Throughout the                            procedure, the patient's blood pressure, pulse, and                            oxygen saturations were monitored continuously. The                            [id number] ([id number]) scope was introduced through                            the anus and advanced to the 10 cm into the ileum.                            The terminal ileum, ileocecal valve, appendiceal                            orifice, and rectum were photographed. The                            colonoscopy was somewhat difficult due to a                            tortuous colon. Successful completion of the                            procedure was aided by COLOWRAP. The patient                            tolerated the procedure well. The quality of the                            bowel preparation was excellent. Scope In: 12:19:38 PM Scope Out: 12:38:04 PM Scope Withdrawal Time: 0 hours 12 minutes 34 seconds  Total Procedure Duration: 0 hours 18 minutes 26 seconds  Findings:      The terminal ileum appeared normal.      The recto-sigmoid colon, sigmoid colon and descending colon were       moderately redundant. Biopsies for histology were taken with a cold       forceps from the cecum, ascending colon, transverse colon, descending  colon and sigmoid colon for evaluation of microscopic colitis.      Non-bleeding internal hemorrhoids were found. The hemorrhoids were small. Impression:               - NO OBVIOUS SOURCE FOR DIARRHEA IDENTIFIED.                            SYMPTOMS MOST LIKLEY DUE TO IBS-D EXACERBATED BY                            ANXIETY/DEPRESSION, LESS LIKLEY H PYLORI GASTRITIS,                            CELIAC SPRUE, MICROSCOPIC COLITIS/GASTROPARESIS, OR                            EOSINOPHILIC GASTROENTERITIS                           - Redundant LEFT colon.                           - RECTAL BLEEDING DUE TO internal hemorrhoids. Moderate Sedation:      Per  Anesthesia Care Recommendation:           - FODMAP DIET.                           - Continue present medications. ADD PAXIL 10 MG                            DAILY FOR 2 WEEKS AND INCREASE TO 20 MG DAILY IF                            ABDOMINAL PAIN/DIARRHEA NOT IMPROVED AFTER 2 WEEKS.                            IMODIUM IF NEEDED TO CONTROL LOOSE STOOLS. TYLENOL                            PRN ABDOMINAL PAIN. PHENERGAN PRN NAUSEA/VOMTIING.                            BID PPI.                           - Await pathology results.                           - Return to my office in 4 months.                           - Patient has a contact number available for                            emergencies. The signs and symptoms of potential  delayed complications were discussed with the                            patient. Return to normal activities tomorrow.                            Written discharge instructions were provided to the                            patient.                           - No repeat colonoscopy due to age. Procedure Code(s):        --- Professional ---                           [phone] [id number], Colonoscopy, flexible; with biopsy, single                            or multiple Diagnosis Code(s):        --- Professional ---                           K64.8, Other hemorrhoids                           R10.33, Periumbilical pain                           R19.7, Diarrhea, unspecified                           K92.1, Melena (includes Hematochezia)                           Q43.8, Other specified congenital malformations of                            intestine CPT copyright 2016 American [employer] Association. All rights reserved. The codes documented in this report are preliminary and upon coder review may  be revised to meet current compliance requirements. Jonette EvaSandi Travers Goodley, MD Jonette EvaSandi Magdelyn Roebuck, MD 03/08/2016 1:04:13 PM This report has been signed  electronically. Number of Addenda: 0

## 2016-03-08 NOTE — Transfer of Care (Signed)
Immediate Anesthesia Transfer of Care Note  Patient: Misty Figueroa  Procedure(s) Performed: Procedure(s) with comments: COLONOSCOPY WITH PROPOFOL (N/A) - 1230  ESOPHAGOGASTRODUODENOSCOPY (EGD) WITH PROPOFOL (N/A)  Patient Location: PACU  Anesthesia Type:MAC  Level of Consciousness: awake, alert  and oriented  Airway & Oxygen Therapy: Patient Spontanous Breathing  Post-op Assessment: Report given to RN  Post vital signs: Reviewed and stable  Last Vitals:  Vitals:   03/08/16 1205 03/08/16 1300  BP: 108/70 107/66  Pulse:  (!) 102  Resp: 19 12  Temp:  37.1 C    Last Pain:  Vitals:   03/08/16 1214  TempSrc:   PainSc: 6       Patients Stated Pain Goal: 6 (03/08/16 1055)  Complications: No apparent anesthesia complications

## 2016-03-08 NOTE — Discharge Instructions (Signed)
YOUR RECTAL BLEEDING IS DUE TO SMALL internal hemorrhoids. You have MILD gastritis. THE LAST PART OF YOUR SMALL BOWEL AND COLON ARE NORMAL. I biopsied your stomach, SMALL BOWEL, & colon.    FOLLOW A LOW FODMAP DIET. SEE HANDOUT.  ADD PAXIL 10 MG DAILY FOR 2 WEEKS. IF AFTER 2 WEEKS YOUR ABDOMINAL PAIN   AND DIARHHEA ARE NOT IMPROVED, INCREASE TO 20 MG DAILY.  YOUR BIOPSY RESULTS WILL BE AVAILABLE IN MY CHART NOV 24 AND MY OFFICE WILL CONTACT YOU IN 10-14 DAYS WITH YOUR RESULTS.   CONTINUE PROTONIX. INCREASE DOSE. TAKE 30 MINUTES PRIOR TO MEALS TWICE DAILY.  FOLLOW UP IN 4 MOS.   Next colonoscopy AT AGE 20.   ENDOSCOPY Care After Read the instructions outlined below and refer to this sheet in the next week. These discharge instructions provide you with general information on caring for yourself after you leave the hospital. While your treatment has been planned according to the most current medical practices available, unavoidable complications occasionally occur. If you have any problems or questions after discharge, call DR. Dortha Neighbors, [phone].  ACTIVITY  You may resume your regular activity, but move at a slower pace for the next 24 hours.   Take frequent rest periods for the next 24 hours.   Walking will help get rid of the air and reduce the bloated feeling in your belly (abdomen).   No driving for 24 hours (because of the medicine (anesthesia) used during the test).   You may shower.   Do not sign any important legal documents or operate any machinery for 24 hours (because of the anesthesia used during the test).    NUTRITION  Drink plenty of fluids.   You may resume your normal diet as instructed by your doctor.   Begin with a light meal and progress to your normal diet. Heavy or fried foods are harder to digest and may make you feel sick to your stomach (nauseated).   Avoid alcoholic beverages for 24 hours or as instructed.    MEDICATIONS  You may resume your  normal medications.   WHAT YOU CAN EXPECT TODAY  Some feelings of bloating in the abdomen.   Passage of more gas than usual.   Spotting of blood in your stool or on the toilet paper  .  IF YOU HAD POLYPS REMOVED DURING THE ENDOSCOPY:  Eat a soft diet IF YOU HAVE NAUSEA, BLOATING, ABDOMINAL PAIN, OR VOMITING.    FINDING OUT THE RESULTS OF YOUR TEST Not all test results are available during your visit. DR. Darrick PennaFIELDS WILL CALL YOU WITHIN 14 DAYS OF YOUR PROCEDUE WITH YOUR RESULTS. Do not assume everything is normal if you have not heard from DR. Evoleth Nordmeyer, CALL HER OFFICE AT [phone].  SEEK IMMEDIATE MEDICAL ATTENTION AND CALL THE OFFICE: [phone] IF:  You have more than a spotting of blood in your stool.   Your belly is swollen (abdominal distention).   You are nauseated or vomiting.   You have a temperature over 101F.   You have abdominal pain or discomfort that is severe or gets worse throughout the day.    Lifestyle and home remedies TO CONTROL REFLUX  You may eliminate or reduce the frequency of heartburn by making the following lifestyle changes:   Control your weight. Being overweight is a major risk factor for heartburn and GERD. Excess pounds put pressure on your abdomen, pushing up your stomach and causing acid to back up into your esophagus.  Eat smaller meals. 4 TO 6 MEALS A DAY. This reduces pressure on the lower esophageal sphincter, helping to prevent the valve from opening and acid from washing back into your esophagus.    Loosen your belt. Clothes that fit tightly around your waist put pressure on your abdomen and the lower esophageal sphincter.    Eliminate heartburn triggers. Everyone has specific triggers. Common triggers such as fatty or fried foods, spicy food, tomato sauce, carbonated beverages, alcohol, chocolate, mint, garlic, onion, caffeine and nicotine may make heartburn worse.    Avoid stooping or bending. Tying your shoes is OK.  Bending over for longer periods to weed your garden isn't, especially soon after eating.    Don't lie down after a meal. Wait at least three to four hours after eating before going to bed, and don't lie down right after eating.    Alternative medicine  Several home remedies exist for treating GERD, but they provide only temporary relief. They include drinking baking soda (sodium bicarbonate) added to water or drinking other fluids such as baking soda mixed with cream of tartar and water.   Although these liquids create temporary relief by neutralizing, washing away or buffering acids, eventually they aggravate the situation by adding gas and fluid to your stomach, increasing pressure and causing more acid reflux. Further, adding more sodium to your diet may increase your blood pressure and add stress to your heart, and excessive bicarbonate ingestion can alter the acid-base balance in your body.  Gastritis  Gastritis is an inflammation (the body's way of reacting to injury and/or infection) of the stomach. It is often caused by viral or bacterial (germ) infections. It can also be caused BY ASPIRIN, BC/GOODY POWDER'S, (IBUPROFEN) MOTRIN, OR ALEVE (NAPROXEN), chemicals (including alcohol), SPICY FOODS, and medications. This illness may be associated with generalized malaise (feeling tired, not well), UPPER ABDOMINAL STOMACH cramps, and fever. One common bacterial cause of gastritis is an organism known as H. Pylori. This can be treated with antibiotics.     Hemorrhoids Hemorrhoids are dilated (enlarged) veins around the rectum. Sometimes clots will form in the veins. This makes them swollen and painful. These are called thrombosed hemorrhoids. Causes of hemorrhoids include:  Constipation.   Straining to have a bowel movement.   HEAVY LIFTING  HOME CARE INSTRUCTIONS  Eat a well balanced diet and drink 6 to 8 glasses of water every day to avoid constipation. You may also use a bulk  laxative.   Avoid straining to have bowel movements.   Keep anal area dry and clean.   Do not use a donut shaped pillow or sit on the toilet for long periods. This increases blood pooling and pain.   Move your bowels when your body has the urge; this will require less straining and will decrease pain and pressure.

## 2016-03-08 NOTE — Op Note (Signed)
[facility] Patient Name: Misty Figueroa Procedure Date: 03/08/2016 12:40 PM MRN: [id number] Date of Birth: 04/01/1996 Attending MD: Jonette Eva , MD CSN: [id number] Age: 20 Admit Type: Outpatient Procedure:                Upper GI endoscopy WITH COLD FORCEPS BIOPSY Indications:              Epigastric abdominal pain, Heartburn, Nausea with                            vomiting. PT HAD A TRAUMATIC HIGH SCHOOL                            EXPERIENCE. USES EXECDRIN HA PRN. Providers:                Jonette Eva, MD, Brain Hilts RN, RN, Toniann Fail RN, RN, Birder Robson, Technician Referring MD:             Jennette Banker CRAWFORD, MD Medicines:                Propofol per Anesthesia Complications:            No immediate complications. Estimated Blood Loss:     Estimated blood loss was minimal. Procedure:                Pre-Anesthesia Assessment:                           - Prior to the procedure, a History and Physical                            was performed, and patient medications and                            allergies were reviewed. The patient's tolerance of                            previous anesthesia was also reviewed. The risks                            and benefits of the procedure and the sedation                            options and risks were discussed with the patient.                            All questions were answered, and informed consent                            was obtained. Prior Anticoagulants: The patient has                            taken aspirin. ASA Grade Assessment: II - A patient  with mild systemic disease. After reviewing the                            risks and benefits, the patient was deemed in                            satisfactory condition to undergo the procedure.                            After obtaining informed consent, the endoscope was                            passed  under direct vision. Throughout the                            procedure, the patient's blood pressure, pulse, and                            oxygen saturations were monitored continuously. The                            [id number] ([id number]) was introduced through the                            mouth, and advanced to the third part of duodenum.                            The upper GI endoscopy was accomplished without                            difficulty. The patient tolerated the procedure                            well. Scope In: 12:43:44 PM Scope Out: 12:52:21 PM Total Procedure Duration: 0 hours 8 minutes 37 seconds  Findings:      The examined esophagus was normal.      Patchy mild inflammation characterized by congestion (edema) and       erythema was found in the gastric antrum. Biopsies were taken with a       cold forceps for Helicobacter pylori testing.      The examined duodenum was normal. Biopsies for histology were taken with       a cold forceps for evaluation of celiac disease. Impression:               - NO OBVIOUS SOURCE FOR DIARRHEA IDENTIFIED.                            SYMTPOMS MOST LIKELY DUE TO IBS-D AND EXACERBATED                            BY ANXIETY/DEPRESSION AND NSAID GASTRITIS.                           - MILD Gastritis. Biopsied.                           -  Moderate Sedation:      Per Anesthesia Care Recommendation:           - LOW FODMAP DIET.                           - Continue present medications.                           - Await pathology results.                           - Return to my office in 4 months.                           - Patient has a contact number available for                            emergencies. The signs and symptoms of potential                            delayed complications were discussed with the                            patient. Return to normal activities tomorrow.                            Written discharge  instructions were provided to the                            patient. Procedure Code(s):        --- Professional ---                           [phone], Esophagogastroduodenoscopy, flexible,                            transoral; with biopsy, single or multiple Diagnosis Code(s):        --- Professional ---                           K29.70, Gastritis, unspecified, without bleeding                           R10.13, Epigastric pain                           R12, Heartburn                           R11.2, Nausea with vomiting, unspecified CPT copyright 2016 American Medical Association. All rights reserved. The codes documented in this report are preliminary and upon coder review may  be revised to meet current compliance requirements. Jonette EvaSandi Kaesha Kirsch, MD Jonette EvaSandi Lowen Mansouri, MD 03/08/2016 1:09:00 PM This report has been signed electronically. Number of Addenda: 0

## 2016-03-08 NOTE — Anesthesia Postprocedure Evaluation (Signed)
Anesthesia Post Note  Patient: Misty Figueroa  Procedure(s) Performed: Procedure(s) (LRB): COLONOSCOPY WITH PROPOFOL (N/A) ESOPHAGOGASTRODUODENOSCOPY (EGD) WITH PROPOFOL (N/A)  Patient location during evaluation: PACU Anesthesia Type: MAC Level of consciousness: awake and alert Pain management: pain level controlled Vital Signs Assessment: post-procedure vital signs reviewed and stable Respiratory status: spontaneous breathing Cardiovascular status: blood pressure returned to baseline Postop Assessment: no signs of nausea or vomiting Anesthetic complications: no    Last Vitals:  Vitals:   03/08/16 1205 03/08/16 1300  BP: 108/70 107/66  Pulse:  (!) 102  Resp: 19 12  Temp:  37.1 C    Last Pain:  Vitals:   03/08/16 1214  TempSrc:   PainSc: 6                  Harith Mccadden

## 2016-03-08 NOTE — Anesthesia Preprocedure Evaluation (Signed)
Anesthesia Evaluation  Patient identified by MRN, date of birth, ID band Patient awake    Reviewed: Allergy & Precautions, NPO status , Patient's Chart, lab work & pertinent test results  Airway Mallampati: I  TM Distance: >3 FB     Dental  (+) Teeth Intact   Pulmonary neg pulmonary ROS,    breath sounds clear to auscultation       Cardiovascular negative cardio ROS   Rhythm:Regular Rate:Normal     Neuro/Psych    GI/Hepatic GERD  ,  Endo/Other    Renal/GU      Musculoskeletal   Abdominal   Peds  (+) premature delivery Hematology   Anesthesia Other Findings   Reproductive/Obstetrics                             Anesthesia Physical Anesthesia Plan  ASA: II  Anesthesia Plan: MAC   Post-op Pain Management:    Induction: Intravenous  Airway Management Planned: Simple Face Mask  Additional Equipment:   Intra-op Plan:   Post-operative Plan:   Informed Consent: I have reviewed the patients History and Physical, chart, labs and discussed the procedure including the risks, benefits and alternatives for the proposed anesthesia with the patient or authorized representative who has indicated his/her understanding and acceptance.     Plan Discussed with:   Anesthesia Plan Comments:         Anesthesia Quick Evaluation

## 2016-03-08 NOTE — H&P (Signed)
Primary Care Physician:  Hendricks LimesRAWFORD, BRANDON WAYNE, DO Primary Gastroenterologist:  Dr. Darrick PennaFields  Pre-Procedure History & Physical: HPI:  Misty Figueroa is a 20 y.o. female here for ABDOMINAL  PAIN/DIARRHEA.  Past Medical History:  Diagnosis Date  . Abdominal pain   . GERD (gastroesophageal reflux disease)   . Premature baby    born at 8227 weeks    Past Surgical History:  Procedure Laterality Date  . CHOLECYSTECTOMY      Prior to Admission medications   Medication Sig Start Date End Date Taking? Authorizing Provider  diphenhydrAMINE (BENADRYL) 25 mg capsule Take 50 mg by mouth every 6 (six) hours as needed. Takes for hives (autoimmune disorder)    Yes Historical Provider, MD  diphenhydramine-acetaminophen (TYLENOL PM) 25-500 MG TABS tablet Take 2 tablets by mouth at bedtime as needed (sleep).   Yes Historical Provider, MD  pantoprazole (PROTONIX) 40 MG tablet Take 1 tablet (40 mg total) by mouth daily. 02/16/16  Yes Gelene MinkAnna W Boone, NP  Sod Picosulfate-Mag Ox-Cit Acd 10-3.5-12 MG-GM-GM PACK Take 1 Container by mouth as directed. 02/10/16  Yes West BaliSandi L Fields, MD  aspirin-acetaminophen-caffeine (EXCEDRIN MIGRAINE) [phone] MG tablet Take 2 tablets by mouth every 6 (six) hours as needed for headache or migraine.    Historical Provider, MD  HYDROcodone-acetaminophen (NORCO/VICODIN) 5-325 MG tablet Take 1 tablet by mouth every 6 (six) hours as needed for severe pain. Patient not taking: Reported on 02/29/2016 02/08/16   Lora PaulaJennifer T Krall, MD  promethazine (PHENERGAN) 12.5 MG tablet Take 1 tablet (12.5 mg total) by mouth every 6 (six) hours as needed for nausea or vomiting. 02/08/16   Lora PaulaJennifer T Krall, MD    Allergies as of 02/10/2016 - Review Complete 02/10/2016  Allergen Reaction Noted  . Morphine and related  01/25/2016  . Penicillins Hives 01/07/2016  . Prochlorperazine  01/07/2016    Family History  Problem Relation Age of Onset  . Hypertension Father   . Hypertension Maternal  Grandmother   . Stroke Paternal Grandmother   . Diabetes Paternal Grandmother   . Pulmonary fibrosis Paternal Grandfather   . Colon cancer Neg Hx   . Crohn's disease Neg Hx   . Inflammatory bowel disease Neg Hx     Social History   Social History  . Marital status: Single    Spouse name: N/A  . Number of children: N/A  . Years of education: N/A   Occupational History  . Not on file.   Social History Main Topics  . Smoking status: Never Smoker  . Smokeless tobacco: Never Used  . Alcohol use No  . Drug use: No  . Sexual activity: Not Currently   Other Topics Concern  . Not on file   Social History Narrative  . No narrative on file    Review of Systems: See HPI, otherwise negative ROS   Physical Exam: BP 108/64   Pulse 82   Temp 98.5 F (36.9 C) (Oral)   Resp 20   Ht 5\' 4"  (1.626 m)   Wt 91 lb (41.3 kg)   LMP 02/15/2016 (Approximate)   SpO2 100%   BMI 15.62 kg/m  General:   Alert,  pleasant and cooperative in NAD [place]:  Normocephalic and atraumatic. Neck:  Supple; Lungs:  Clear throughout to auscultation.    Heart:  Regular rate and rhythm. Abdomen:  Soft, nontender and nondistended. Normal bowel sounds, without guarding, and without rebound.   Neurologic:  Alert and  oriented x4;  grossly normal neurologically.  Impression/Plan:  ABDOMINAL  PAIN/DIARRHEA/weight loss  PLAN: 1. TCS/EGD TODAY-BIOPSY COLON, STOMACH, & DUODENUM. DISCUSSED PROCEDURE, BENEFITS, & RISKS: < 1% chance of medication reaction, bleeding, perforation, or rupture of spleen/liver.

## 2016-03-09 ENCOUNTER — Encounter: Payer: Self-pay | Admitting: Gastroenterology

## 2016-03-12 MED ORDER — PAROXETINE HCL 20 MG PO TABS: ORAL_TABLET | ORAL | 11 refills | Status: DC

## 2016-03-12 NOTE — Telephone Encounter (Signed)
RX CHANGED.

## 2016-03-12 NOTE — Telephone Encounter (Signed)
NEW PRESCRIPTION SENT TODAY. YOU STOMACH BIOPSY SHOWS GASTRITIS. YOUR SMALL BOWEL AND COLON BIOPSIES ARE NORMAL. SYMPTOMS ARE MOST LIKELY DUE TO IBS.

## 2016-03-14 ENCOUNTER — Encounter (HOSPITAL_COMMUNITY): Payer: Self-pay | Admitting: Gastroenterology

## 2016-03-16 ENCOUNTER — Telehealth: Payer: Self-pay | Admitting: Gastroenterology

## 2016-03-16 NOTE — Telephone Encounter (Signed)
Please call pt. Her colon and small bowel biopsies are normal. HER stomach Bx shows mild gastritis. HER SYMPTOMS ARE MOST LIKELY DUE TO IBS.   FOLLOW A LOW FODMAP DIET.   ADD PAXIL 10 MG DAILY FOR 2 WEEKS. IF AFTER 2 WEEKS YOUR ABDOMINAL PAIN  AND DIARHHEA ARE NOT IMPROVED, INCREASE TO 20 MG DAILY.  CONTINUE PROTONIX. INCREASE DOSE. TAKE 30 MINUTES PRIOR TO MEALS TWICE DAILY.  FOLLOW UP IN 4 MOS E30 ABDOMINAL PAIN AND DIARRHEA.   Next colonoscopy AT AGE 20.

## 2016-04-06 ENCOUNTER — Ambulatory Visit: Payer: BC Managed Care – PPO | Admitting: Gastroenterology

## 2016-04-07 ENCOUNTER — Ambulatory Visit: Payer: BC Managed Care – PPO | Admitting: Gastroenterology

## 2016-04-21 ENCOUNTER — Encounter: Payer: Self-pay | Admitting: Gastroenterology

## 2016-04-21 ENCOUNTER — Ambulatory Visit (INDEPENDENT_AMBULATORY_CARE_PROVIDER_SITE_OTHER): Payer: BC Managed Care – PPO | Admitting: Gastroenterology

## 2016-04-21 DIAGNOSIS — R634 Abnormal weight loss: Secondary | ICD-10-CM

## 2016-04-21 DIAGNOSIS — K580 Irritable bowel syndrome with diarrhea: Secondary | ICD-10-CM | POA: Diagnosis not present

## 2016-04-21 DIAGNOSIS — K648 Other hemorrhoids: Secondary | ICD-10-CM | POA: Diagnosis not present

## 2016-04-21 DIAGNOSIS — R1033 Periumbilical pain: Secondary | ICD-10-CM | POA: Diagnosis not present

## 2016-04-21 DIAGNOSIS — M7918 Myalgia, other site: Secondary | ICD-10-CM

## 2016-04-21 DIAGNOSIS — K589 Irritable bowel syndrome without diarrhea: Secondary | ICD-10-CM | POA: Insufficient documentation

## 2016-04-21 NOTE — Assessment & Plan Note (Signed)
SYMPTOMS FAIRLY WELL CONTROLLED. PT CLINICALLY IMPROVED ON PAXIL.  CONTINUE PAXIL AT 10 MG DAILY. HESITATE TO INCREASE PAXIL DUE TO ITS MAY INCREASE CONSTIPATION. FOLLOW UP IN 4 MOS.  PLEASE CALL OR SEND A MESSAGE VIA MYCHART WITH QUESTIONS OR CONCERNS.

## 2016-04-21 NOTE — Assessment & Plan Note (Signed)
SYMPTOMS CONTROLLED/RESOLVED AND LIKELY DUE TO HEMORRHOIDS.  CONTINUE TO MONITOR SYMPTOMS.

## 2016-04-21 NOTE — Progress Notes (Signed)
CC'ED TO PCP 

## 2016-04-21 NOTE — Progress Notes (Signed)
   Subjective:    Patient ID: Misty DialsStephanie Rudnick, female    DOB: 1996-04-14, 21 y.o.   MRN: [id number]  Hendricks LimesRAWFORD, BRANDON WAYNE, DO  HPI LAST SEEN NOV 2017: LOW FODMAP DIET AND PAXIL STARTED. OCT 2017: 94 LBS. JAN 2018: 97.8 LBS. DIARRHEA BETTER. ONCE IN AWHILE CONSTIPATED. STILL HAVING PAIN IN LEFT PERIUMBILICAL REGION(SHARP, 1-2X A WEEK).  SOLID AND HARD PLACE ABOUT AS BIG AS A LEMON. NO TRIGGERS. BMs: #1-4. YESTERDAY WENT 5 TIMES AND DAY BEFORE MAY BE ZERO.   PT DENIES FEVER, CHILLS, HEMATOCHEZIA, HEMATEMESIS, nausea, vomiting, melena, CHEST PAIN, SHORTNESS OF BREATH,  CHANGE IN BOWEL IN HABITS, problems swallowing, problems with sedation, OR heartburn or indigestion.  Past Medical History:  Diagnosis Date  . Abdominal pain   . GERD (gastroesophageal reflux disease)   . Premature baby    born at 7427 weeks    Past Surgical History:  Procedure Laterality Date  . CHOLECYSTECTOMY    . COLONOSCOPY WITH PROPOFOL N/A 03/08/2016   Normal colon Bx   . ESOPHAGOGASTRODUODENOSCOPY (EGD) WITH PROPOFOL N/A 03/08/2016   MILD GASTRITIS, NL DUO Bx    Allergies  Allergen Reactions  . Morphine And Related     Shaking episodes  . Penicillins Hives      . Prochlorperazine     Muscle spasms    Current Outpatient Prescriptions  Medication Sig Dispense Refill  . aspirin-acetaminophen-caffeine (EXCEDRIN MIGRAINE) 250-250-65 MG tablet Take 2 tablets by mouth every 6 (six) hours as needed for headache or migraine. NONE IN A WK   . diphenhydrAMINE (BENADRYL) 25 mg capsule Take 50 mg by mouth every 6 (six) hours as needed. Takes for hives (autoimmune disorder)     . fexofenadine (ALLEGRA) 30 MG tablet Take 30 mg by mouth 2 (two) times daily.    . Norethindrone-Ethinyl Estradiol-Fe Biphas (LO LOESTRIN FE) 1 MG-10 MCG / 10 MCG tablet Take 1 tablet by mouth daily.    Marland Kitchen. PARoxetine (PAXIL) 20 MG tablet 10 MG DAILY    .      Marland Kitchen. diphenhydramine-acetaminophen (TYLENOL PM) 25-500 MG TABS tablet Take 2 tablets  by mouth at bedtime as needed (sleep).    . pantoprazole (PROTONIX) 40 MG tablet AFTER TCS NO MORE     .       Review of Systems PER HPI OTHERWISE ALL SYSTEMS ARE NEGATIVE.    Objective:   Physical Exam  Constitutional: She is oriented to person, place, and time. She appears well-developed and well-nourished. No distress.  HENT:  [place]: Normocephalic and atraumatic.  Mouth/Throat: Oropharynx is clear and moist. No oropharyngeal exudate.  Eyes: Pupils are equal, round, and reactive to light. No scleral icterus.  Neck: Normal range of motion. Neck supple.  Cardiovascular: Normal rate, regular rhythm and normal heart sounds.   Pulmonary/Chest: Effort normal and breath sounds normal. No respiratory distress.  Abdominal: Soft. Bowel sounds are normal. She exhibits no distension. There is tenderness. There is no rebound and no guarding.  MILD LEFT PERI-UMBILICAL NEAR SURGICAL INCISIONS IN LUQ/LEFT PERIUMBILICAL REGION, POSITIVE CARNETT'S SIGN W/ VALSALVA(ARMS UP AND [employer]/SHOULDERS OFF EXAM TABLE) THAT REPRODUCES PATEINT'S COMPLAINT.   Musculoskeletal: She exhibits no edema.  Lymphadenopathy:    She has no cervical adenopathy.  Neurological: She is alert and oriented to person, place, and time.  NO  NEW FOCAL DEFICITS  Psychiatric:  FLAT AFFECT, NL MOOD  Vitals reviewed.     Assessment & Plan:

## 2016-04-21 NOTE — Patient Instructions (Signed)
CONTINUE PAXIL AT 10 MG DAILY.   ICE PACK THREE TIMES A DAY FOR 3 DAYS WHEN YOU HAVE THE ABDOMINAL WALL BULGE/PAIN.  DO NOT APPLY ICE PACKS DIRECTLY TO YOUR SKIN.  USE TYLENOL OR OTC IBUPROFEN 2 WITH FOOD OR MILK IF NEEDED FOR ADDITIONAL PAIN RELIEF.  FOLLOW UP IN 4 MOS.  PLEASE CALL OR SEND A MESSAGE VIA MYCHART WITH QUESTIONS OR CONCERNS.

## 2016-04-21 NOTE — Assessment & Plan Note (Signed)
WEIGHT UP ~4 LBS SINCE OCT 2017.  CONTINUE TO MONITOR SYMPTOMS.

## 2016-04-21 NOTE — Progress Notes (Signed)
ON RECALL  °

## 2016-04-21 NOTE — Assessment & Plan Note (Signed)
DUE TO ABDOMINAL WALL PAIN IN THE PERI-INCISIONAL AREA. UNABLE TO APPRECIATE HERNIA ON CT OR EXAM. POSITIVE CARNETT'S SIGN.  CONTINUE PAXIL AT 10 MG DAILY. ICE PACK THREE TIMES A DAY FOR 3 DAYS WHEN YOU HAVE THE ABDOMINAL WALL BULGE/PAIN.  DO NOT APPLY ICE PACKS DIRECTLY TO YOUR SKIN. USE TYLENOL OR OTC IBUPROFEN 2 WITH FOOD OR MILK IF NEEDED FOR ADDITIONAL PAIN RELIEF. FOLLOW UP IN 4 MOS.  PLEASE CALL OR SEND A MESSAGE VIA MYCHART WITH QUESTIONS OR CONCERNS.

## 2016-07-06 ENCOUNTER — Ambulatory Visit: Payer: BC Managed Care – PPO | Admitting: Nurse Practitioner

## 2016-07-20 ENCOUNTER — Encounter: Payer: Self-pay | Admitting: Gastroenterology

## 2016-09-28 ENCOUNTER — Encounter (HOSPITAL_COMMUNITY): Payer: Self-pay | Admitting: Emergency Medicine

## 2016-09-28 ENCOUNTER — Emergency Department (HOSPITAL_COMMUNITY)
Admission: EM | Admit: 2016-09-28 | Discharge: 2016-09-28 | Disposition: A | Discharge status: Home / Self Care | Payer: BC Managed Care – PPO | Source: Home / Self Care | Attending: Emergency Medicine | Admitting: Emergency Medicine

## 2016-09-28 ENCOUNTER — Encounter (HOSPITAL_COMMUNITY): Payer: Self-pay | Admitting: *Deleted

## 2016-09-28 ENCOUNTER — Emergency Department (HOSPITAL_COMMUNITY)
Admission: EM | Admit: 2016-09-28 | Discharge: 2016-09-28 | Disposition: A | Discharge status: Home / Self Care | Payer: BC Managed Care – PPO | Attending: Emergency Medicine | Admitting: Emergency Medicine

## 2016-09-28 DIAGNOSIS — Z79899 Other long term (current) drug therapy: Secondary | ICD-10-CM | POA: Insufficient documentation

## 2016-09-28 DIAGNOSIS — Z9049 Acquired absence of other specified parts of digestive tract: Secondary | ICD-10-CM | POA: Insufficient documentation

## 2016-09-28 DIAGNOSIS — R519 Headache, unspecified: Secondary | ICD-10-CM

## 2016-09-28 DIAGNOSIS — Z7982 Long term (current) use of aspirin: Secondary | ICD-10-CM

## 2016-09-28 DIAGNOSIS — R51 Headache: Principal | ICD-10-CM

## 2016-09-28 DIAGNOSIS — G43801 Other migraine, not intractable, with status migrainosus: Secondary | ICD-10-CM

## 2016-09-28 LAB — I-STAT CHEM 8, ED
BUN: 12 mg/dL (ref 6–20)
CALCIUM ION: 1.18 mmol/L (ref 1.15–1.40)
CHLORIDE: 106 mmol/L (ref 101–111)
CREATININE: 0.6 mg/dL (ref 0.44–1.00)
GLUCOSE: 156 mg/dL — AB (ref 65–99)
HCT: 41 % (ref 36.0–46.0)
Hemoglobin: 13.9 g/dL (ref 12.0–15.0)
Potassium: 4 mmol/L (ref 3.5–5.1)
Sodium: 141 mmol/L (ref 135–145)
TCO2: 23 mmol/L (ref 0–100)

## 2016-09-28 MED ORDER — VALPROATE SODIUM 500 MG/5ML IV SOLN
500.0000 mg | Freq: Once | INTRAVENOUS | Status: AC
  Administered 2016-09-28: 500 mg via INTRAVENOUS
  Filled 2016-09-28: qty 5

## 2016-09-28 MED ORDER — VALPROATE SODIUM 500 MG/5ML IV SOLN
INTRAVENOUS | Status: AC
  Filled 2016-09-28: qty 5

## 2016-09-28 MED ORDER — KETOROLAC TROMETHAMINE 30 MG/ML IJ SOLN
15.0000 mg | Freq: Once | INTRAMUSCULAR | Status: AC
  Administered 2016-09-28: 15 mg via INTRAVENOUS
  Filled 2016-09-28: qty 1

## 2016-09-28 MED ORDER — DIPHENHYDRAMINE HCL 50 MG/ML IJ SOLN
25.0000 mg | Freq: Once | INTRAMUSCULAR | Status: AC
  Administered 2016-09-28: 25 mg via INTRAVENOUS
  Filled 2016-09-28: qty 1

## 2016-09-28 MED ORDER — METOCLOPRAMIDE HCL 5 MG/ML IJ SOLN
10.0000 mg | Freq: Once | INTRAMUSCULAR | Status: AC
Start: 2016-09-28 — End: 2016-09-28
  Administered 2016-09-28: 10 mg via INTRAVENOUS
  Filled 2016-09-28: qty 2

## 2016-09-28 MED ORDER — METOCLOPRAMIDE HCL 5 MG/ML IJ SOLN
10.0000 mg | Freq: Once | INTRAMUSCULAR | Status: AC
  Administered 2016-09-28: 10 mg via INTRAVENOUS
  Filled 2016-09-28: qty 2

## 2016-09-28 MED ORDER — TRAMADOL HCL 50 MG PO TABS
50.0000 mg | ORAL_TABLET | ORAL | Status: AC
  Administered 2016-09-28: 50 mg via ORAL
  Filled 2016-09-28: qty 1

## 2016-09-28 MED ORDER — MAGNESIUM SULFATE IN D5W 1-5 GM/100ML-% IV SOLN
1.0000 g | Freq: Once | INTRAVENOUS | Status: AC
  Administered 2016-09-28: 1 g via INTRAVENOUS
  Filled 2016-09-28: qty 100

## 2016-09-28 MED ORDER — LORAZEPAM 2 MG/ML IJ SOLN
0.5000 mg | Freq: Once | INTRAMUSCULAR | Status: AC
Start: 2016-09-28 — End: 2016-09-28
  Administered 2016-09-28: 0.5 mg via INTRAVENOUS
  Filled 2016-09-28: qty 1

## 2016-09-28 MED ORDER — SODIUM CHLORIDE 0.9 % IV BOLUS (SEPSIS)
1000.0000 mL | Freq: Once | INTRAVENOUS | Status: AC
  Administered 2016-09-28: 1000 mL via INTRAVENOUS

## 2016-09-28 MED ORDER — DEXAMETHASONE SODIUM PHOSPHATE 10 MG/ML IJ SOLN
10.0000 mg | Freq: Once | INTRAMUSCULAR | Status: AC
  Administered 2016-09-28: 10 mg via INTRAVENOUS
  Filled 2016-09-28: qty 1

## 2016-09-28 MED ORDER — KETOROLAC TROMETHAMINE 30 MG/ML IJ SOLN
30.0000 mg | Freq: Once | INTRAMUSCULAR | Status: AC
  Administered 2016-09-28: 30 mg via INTRAVENOUS
  Filled 2016-09-28: qty 1

## 2016-09-28 MED ORDER — KETOROLAC TROMETHAMINE 30 MG/ML IJ SOLN: 15.0000 mg | Freq: Once | INTRAMUSCULAR | Status: DC

## 2016-09-28 MED ORDER — HYDROMORPHONE HCL 1 MG/ML IJ SOLN
1.0000 mg | Freq: Once | INTRAMUSCULAR | Status: AC
  Administered 2016-09-28: 1 mg via INTRAVENOUS
  Filled 2016-09-28: qty 1

## 2016-09-28 MED ORDER — TRAMADOL HCL 50 MG PO TABS: 50.0000 mg | ORAL_TABLET | Freq: Four times a day (QID) | ORAL | 0 refills | Status: DC | PRN

## 2016-09-28 NOTE — ED Provider Notes (Signed)
[facility] Provider Note   CSN: [id number] Arrival date & time: 09/28/16  1942     History   Chief Complaint Chief Complaint  Patient presents with  . Headache    HPI Otis DialsStephanie Prill is a 21 y.o. female.  Patient was seen here with migraine headache yesterday. She was treated and the headache returned today.   The history is provided by the patient.  Headache   This is a recurrent problem. The current episode started 12 to 24 hours ago. The problem occurs constantly. The problem has not changed since onset.The headache is associated with nothing. The pain is located in the left unilateral and temporal region. The quality of the pain is described as dull. The pain is at a severity of 7/10.    Past Medical History:  Diagnosis Date  . Abdominal pain   . GERD (gastroesophageal reflux disease)   . Premature baby    born at 3827 weeks    Patient Active Problem List   Diagnosis Date Noted  . IBS (irritable bowel syndrome) 04/21/2016  . Muscular abdominal pain in periumbilical region 02/10/2016  . Hemorrhoids, internal, with bleeding 02/10/2016  . Loss of weight 02/10/2016    Past Surgical History:  Procedure Laterality Date  . CHOLECYSTECTOMY    . COLONOSCOPY WITH PROPOFOL N/A 03/08/2016   Procedure: COLONOSCOPY WITH PROPOFOL;  Surgeon: West BaliSandi L Fields, MD;  Location: [facility];  Service: Endoscopy;  Laterality: N/A;  1230   . ESOPHAGOGASTRODUODENOSCOPY (EGD) WITH PROPOFOL N/A 03/08/2016   Procedure: ESOPHAGOGASTRODUODENOSCOPY (EGD) WITH PROPOFOL;  Surgeon: West BaliSandi L Fields, MD;  Location: [facility];  Service: Endoscopy;  Laterality: N/A;    OB History    No data available       Home Medications    Prior to Admission medications   Medication Sig Start Date End Date Taking? Authorizing Provider  aspirin-acetaminophen-caffeine (EXCEDRIN MIGRAINE) [phone] [id number] MG tablet Take 2 tablets by mouth every 6 (six) hours as needed for headache or migraine.     [provider]  diphenhydrAMINE (BENADRYL) 25 mg capsule Take 50 mg by mouth every 6 (six) hours as needed. Takes for hives (autoimmune disorder)     [provider]  diphenhydramine-acetaminophen (TYLENOL PM) 25-500 MG TABS tablet Take 2 tablets by mouth at bedtime as needed (sleep).    [provider]  fexofenadine (ALLEGRA) 30 MG tablet Take 30 mg by mouth 2 (two) times daily.    [provider]  Norethindrone-Ethinyl Estradiol-Fe Biphas (LO LOESTRIN FE) 1 MG-10 MCG / 10 MCG tablet Take 1 tablet by mouth daily.    [provider]  pantoprazole (PROTONIX) 40 MG tablet 1 PO 30 MINS PRIOR TO MEALS TWICE DAILY. Patient not taking: Reported on 04/21/2016 03/08/16   West BaliFields, Sandi L, MD  PARoxetine (PAXIL) 20 MG tablet 1/ PO DAILY FOR 2 WEEKS AND INCREASE TO ONE TAB DAILY IF ABDOMINAL PAIN AND DIARRHEA ARE NOT IMPROVED. Patient taking differently: Take 10 mg by mouth daily. 1/ PO DAILY FOR 2 WEEKS AND INCREASE TO ONE TAB DAILY IF ABDOMINAL PAIN AND DIARRHEA ARE NOT IMPROVED. 03/12/16   Fields, Darleene CleaverSandi L, MD  promethazine (PHENERGAN) 12.5 MG tablet Take 1 tablet (12.5 mg total) by mouth every 6 (six) hours as needed for nausea or vomiting. Patient not taking: Reported on 04/21/2016 02/08/16   Lora PaulaKrall, Jennifer T, MD  sertraline (ZOLOFT) 50 MG tablet Take 50 mg by mouth daily. 02/28/16   [provider]  traMADol Janean Sark(ULTRAM) 50  MG tablet Take 1 tablet (50 mg total) by mouth every 6 (six) hours as needed. 09/28/16   Bethann Berkshire, MD    Family History Family History  Problem Relation Age of Onset  . Hypertension Father   . Hypertension Maternal Grandmother   . Stroke Paternal Grandmother   . Diabetes Paternal Grandmother   . Pulmonary fibrosis Paternal Grandfather   . Colon cancer Neg Hx   . Crohn's disease Neg Hx   . Inflammatory bowel disease Neg Hx     Social History Social History  Substance Use Topics  . Smoking status: Never Smoker  .  Smokeless tobacco: Never Used  . Alcohol use No     Allergies   Morphine and related; Penicillins; and Prochlorperazine   Review of Systems Review of Systems  Constitutional: Negative for appetite change and fatigue.  HENT: Negative for congestion, ear discharge and sinus pressure.   Eyes: Negative for discharge.  Respiratory: Negative for cough.   Cardiovascular: Negative for chest pain.  Gastrointestinal: Negative for abdominal pain and diarrhea.  Genitourinary: Negative for frequency and hematuria.  Musculoskeletal: Negative for back pain.  Skin: Negative for rash.  Neurological: Positive for headaches. Negative for seizures.  Psychiatric/Behavioral: Negative for hallucinations.     Physical Exam Updated Vital Signs BP 135/82   Pulse (!) 125   Temp 98.1 F (36.7 C) (Oral)   Resp 20   Ht 5\' 4"  (1.626 m)   Wt 45.8 kg (101 lb)   LMP 09/28/2016   SpO2 95%   BMI 17.34 kg/m   Physical Exam  Constitutional: She is oriented to person, place, and time. She appears well-developed.  HENT:  [place]: Normocephalic.  Eyes: Conjunctivae and EOM are normal. No scleral icterus.  Neck: Neck supple. No thyromegaly present.  Cardiovascular: Normal rate and regular rhythm.  Exam reveals no gallop and no friction rub.   No murmur heard. Pulmonary/Chest: No stridor. She has no wheezes. She has no rales. She exhibits no tenderness.  Abdominal: She exhibits no distension. There is no tenderness. There is no rebound.  Musculoskeletal: Normal range of motion. She exhibits no edema.  Lymphadenopathy:    She has no cervical adenopathy.  Neurological: She is oriented to person, place, and time. She exhibits normal muscle tone. Coordination normal.  Skin: No rash noted. No erythema.  Psychiatric: She has a normal mood and affect. Her behavior is normal.     ED Treatments / Results  Labs (all labs ordered are listed, but only abnormal results are displayed) Labs Reviewed  I-STAT CHEM 8,  ED - Abnormal; Notable for the following:       Result Value   Glucose, Bld 156 (*)    All other components within normal limits    EKG  EKG Interpretation None       Radiology No results found.  Procedures Procedures (including critical care time)  Medications Ordered in ED Medications  sodium chloride 0.9 % bolus 1,000 mL (1,000 mLs Intravenous New Bag/Given 09/28/16 2147)  ketorolac (TORADOL) 30 MG/ML injection 30 mg (30 mg Intravenous Given 09/28/16 2146)  diphenhydrAMINE (BENADRYL) injection 25 mg (25 mg Intravenous Given 09/28/16 2146)  metoCLOPramide (REGLAN) injection 10 mg (10 mg Intravenous Given 09/28/16 2146)  HYDROmorphone (DILAUDID) injection 1 mg (1 mg Intravenous Given 09/28/16 2302)  LORazepam (ATIVAN) injection 0.5 mg (0.5 mg Intravenous Given 09/28/16 2302)     Initial Impression / Assessment and Plan / ED Course  I have reviewed the triage vital signs  and the nursing notes.  Pertinent labs & imaging results that were available during my care of the patient were reviewed by me and considered in my medical decision making (see chart for details).     Patient's headache improved with treatment. Diagnosis is migraine headache. She will be discharged home with some Ultram and will follow-up with her PCP  Final Clinical Impressions(s) / ED Diagnoses   Final diagnoses:  Bad headache    New Prescriptions New Prescriptions   TRAMADOL (ULTRAM) 50 MG TABLET    Take 1 tablet (50 mg total) by mouth every 6 (six) hours as needed.  Patient complains of   Bethann Berkshire, MD 09/28/16 2335

## 2016-09-28 NOTE — ED Triage Notes (Signed)
Pt c/o headache, states that she was seen in er this am and was better, went home then headache returned,

## 2016-09-28 NOTE — ED Provider Notes (Signed)
[facility] Provider Note   CSN: [id number] Arrival date & time: 09/28/16  0228     History   Chief Complaint Chief Complaint  Patient presents with  . Headache    HPI Misty Figueroa is a 21 y.o. female.  Patient presents to the emergency department for evaluation of headache. Patient has a history of recurrent migraines. Patient reports that she has been experiencing a migraine headache for the last 4 days. She reports global pounding headache with light sensitivity. She has had mild nausea no vomiting. There is no fever, she has not had any neck pain or stiffness. This is similar to previous migraines she has had in the past. Patient was seen in urgent care 2 days ago and given an injection of Toradol and Imitrex but did not achieve any relief.      Past Medical History:  Diagnosis Date  . Abdominal pain   . GERD (gastroesophageal reflux disease)   . Premature baby    born at 79 weeks    Patient Active Problem List   Diagnosis Date Noted  . IBS (irritable bowel syndrome) 04/21/2016  . Muscular abdominal pain in periumbilical region 02/10/2016  . Hemorrhoids, internal, with bleeding 02/10/2016  . Loss of weight 02/10/2016    Past Surgical History:  Procedure Laterality Date  . CHOLECYSTECTOMY    . COLONOSCOPY WITH PROPOFOL N/A 03/08/2016   Procedure: COLONOSCOPY WITH PROPOFOL;  Surgeon: West Bali, MD;  Location: [facility];  Service: Endoscopy;  Laterality: N/A;  1230   . ESOPHAGOGASTRODUODENOSCOPY (EGD) WITH PROPOFOL N/A 03/08/2016   Procedure: ESOPHAGOGASTRODUODENOSCOPY (EGD) WITH PROPOFOL;  Surgeon: West Bali, MD;  Location: [facility];  Service: Endoscopy;  Laterality: N/A;    OB History    No data available       Home Medications    Prior to Admission medications   Medication Sig Start Date End Date Taking? Authorizing Provider  aspirin-acetaminophen-caffeine (EXCEDRIN MIGRAINE) [id number] MG tablet Take 2 tablets by mouth  every 6 (six) hours as needed for headache or migraine.   Yes [provider]  diphenhydrAMINE (BENADRYL) 25 mg capsule Take 50 mg by mouth every 6 (six) hours as needed. Takes for hives (autoimmune disorder)    Yes [provider]  diphenhydramine-acetaminophen (TYLENOL PM) 25-500 MG TABS tablet Take 2 tablets by mouth at bedtime as needed (sleep).   Yes [provider]  fexofenadine (ALLEGRA) 30 MG tablet Take 30 mg by mouth 2 (two) times daily.   Yes [provider]  Norethindrone-Ethinyl Estradiol-Fe Biphas (LO LOESTRIN FE) 1 MG-10 MCG / 10 MCG tablet Take 1 tablet by mouth daily.   Yes [provider]  pantoprazole (PROTONIX) 40 MG tablet 1 PO 30 MINS PRIOR TO MEALS TWICE DAILY. Patient not taking: Reported on 04/21/2016 03/08/16   West Bali, MD  PARoxetine (PAXIL) 20 MG tablet 1/ PO DAILY FOR 2 WEEKS AND INCREASE TO ONE TAB DAILY IF ABDOMINAL PAIN AND DIARRHEA ARE NOT IMPROVED. Patient taking differently: Take 10 mg by mouth daily. 1/ PO DAILY FOR 2 WEEKS AND INCREASE TO ONE TAB DAILY IF ABDOMINAL PAIN AND DIARRHEA ARE NOT IMPROVED. 03/12/16   Fields, Darleene Cleaver, MD  promethazine (PHENERGAN) 12.5 MG tablet Take 1 tablet (12.5 mg total) by mouth every 6 (six) hours as needed for nausea or vomiting. Patient not taking: Reported on 04/21/2016 02/08/16   Lora Paula, MD  sertraline (ZOLOFT) 50 MG tablet Take 50 mg by mouth daily.  02/28/16   [provider]    Family History Family History  Problem Relation Age of Onset  . Hypertension Father   . Hypertension Maternal Grandmother   . Stroke Paternal Grandmother   . Diabetes Paternal Grandmother   . Pulmonary fibrosis Paternal Grandfather   . Colon cancer Neg Hx   . Crohn's disease Neg Hx   . Inflammatory bowel disease Neg Hx     Social History Social History  Substance Use Topics  . Smoking status: Never Smoker  . Smokeless tobacco: Never Used  . Alcohol use No      Allergies   Morphine and related; Penicillins; and Prochlorperazine   Review of Systems Review of Systems  Eyes: Positive for photophobia.  Neurological: Positive for headaches.  All other systems reviewed and are negative.    Physical Exam Updated Vital Signs BP 131/87   Pulse (!) 101   Temp 98.3 F (36.8 C)   Resp 17   Ht 5\' 4"  (1.626 m)   Wt 45.8 kg (101 lb)   LMP 09/28/2016   SpO2 99%   BMI 17.34 kg/m   Physical Exam  Constitutional: She is oriented to person, place, and time. She appears well-developed and well-nourished. No distress.  HENT:  [place]: Normocephalic and atraumatic.  Right Ear: Hearing normal.  Left Ear: Hearing normal.  Nose: Nose normal.  Mouth/Throat: Oropharynx is clear and moist and mucous membranes are normal.  Eyes: Conjunctivae and EOM are normal. Pupils are equal, round, and reactive to light.  Neck: Normal range of motion. Neck supple.  Cardiovascular: Regular rhythm, S1 normal and S2 normal.  Exam reveals no gallop and no friction rub.   No murmur heard. Pulmonary/Chest: Effort normal and breath sounds normal. No respiratory distress. She exhibits no tenderness.  Abdominal: Soft. Normal appearance and bowel sounds are normal. There is no hepatosplenomegaly. There is no tenderness. There is no rebound, no guarding, no tenderness at McBurney's point and negative Murphy's sign. No hernia.  Musculoskeletal: Normal range of motion.  Neurological: She is alert and oriented to person, place, and time. She has normal strength. No cranial nerve deficit or sensory deficit. Coordination normal. GCS eye subscore is 4. GCS verbal subscore is 5. GCS motor subscore is 6.  Skin: Skin is warm, dry and intact. No rash noted. No cyanosis.  Psychiatric: She has a normal mood and affect. Her speech is normal and behavior is normal. Thought content normal.  Nursing note and vitals reviewed.    ED Treatments / Results  Labs (all labs ordered are listed,  but only abnormal results are displayed) Labs Reviewed - No data to display  EKG  EKG Interpretation None       Radiology No results found.  Procedures Procedures (including critical care time)  Medications Ordered in ED Medications  sodium chloride 0.9 % bolus 1,000 mL (not administered)  diphenhydrAMINE (BENADRYL) injection 25 mg (not administered)  ketorolac (TORADOL) 30 MG/ML injection 15 mg (not administered)  dexamethasone (DECADRON) injection 10 mg (not administered)  magnesium sulfate IVPB 1 g 100 mL (not administered)  metoCLOPramide (REGLAN) injection 10 mg (not administered)     Initial Impression / Assessment and Plan / ED Course  I have reviewed the triage vital signs and the nursing notes.  Pertinent labs & imaging results that were available during my care of the patient were reviewed by me and considered in my medical decision making (see chart for details).     Patient presents to the  emergency department for evaluation of migraine headache. Patient is currently experiencing a headache that is similar to previous migraines. Patient does not have any unusual features compared to previous migraines. Patient has normal neurologic examination. There are no unusual features, such as unusual intensity or sudden onset. As this headache is similar to previous migraines, there is no concern for subarachnoid hemorrhage or other etiology. Patient therefore does not require imaging. Patient treated as migraine headache.  Final Clinical Impressions(s) / ED Diagnoses   Final diagnoses:  Other migraine with status migrainosus, not intractable    New Prescriptions New Prescriptions   No medications on file     Gilda CreasePollina, Christopher J, MD 09/28/16 [phone]

## 2016-09-28 NOTE — ED Triage Notes (Signed)
Pt c/o headache x 4 days and burning of Rhoda x 2 days. Pt states she went to urgent care and was given toradol and imitrex with no relief.

## 2016-09-28 NOTE — Discharge Instructions (Signed)
Follow-up with her doctor. Return here if any problem

## 2016-09-30 ENCOUNTER — Encounter (HOSPITAL_COMMUNITY): Payer: Self-pay | Admitting: Emergency Medicine

## 2016-09-30 ENCOUNTER — Emergency Department (HOSPITAL_COMMUNITY): Payer: BC Managed Care – PPO

## 2016-09-30 ENCOUNTER — Emergency Department (HOSPITAL_COMMUNITY)
Admission: EM | Admit: 2016-09-30 | Discharge: 2016-09-30 | Disposition: A | Discharge status: Home / Self Care | Payer: BC Managed Care – PPO | Attending: Emergency Medicine | Admitting: Emergency Medicine

## 2016-09-30 DIAGNOSIS — G4489 Other headache syndrome: Secondary | ICD-10-CM | POA: Diagnosis present

## 2016-09-30 DIAGNOSIS — Z79899 Other long term (current) drug therapy: Secondary | ICD-10-CM | POA: Insufficient documentation

## 2016-09-30 LAB — CBC WITH DIFFERENTIAL/PLATELET
BASOS ABS: 0 10*3/uL (ref 0.0–0.1)
BASOS PCT: 0 %
EOS ABS: 0.1 10*3/uL (ref 0.0–0.7)
Eosinophils Relative: 1 %
HCT: 41.2 % (ref 36.0–46.0)
Hemoglobin: 13.8 g/dL (ref 12.0–15.0)
Lymphocytes Relative: 46 %
Lymphs Abs: 3.5 10*3/uL (ref 0.7–4.0)
MCH: 29.7 pg (ref 26.0–34.0)
MCHC: 33.5 g/dL (ref 30.0–36.0)
MCV: 88.8 fL (ref 78.0–100.0)
MONO ABS: 0.4 10*3/uL (ref 0.1–1.0)
Monocytes Relative: 5 %
NEUTROS ABS: 3.5 10*3/uL (ref 1.7–7.7)
Neutrophils Relative %: 48 %
PLATELETS: 251 10*3/uL (ref 150–400)
RBC: 4.64 MIL/uL (ref 3.87–5.11)
RDW: 12.3 % (ref 11.5–15.5)
WBC: 7.5 10*3/uL (ref 4.0–10.5)

## 2016-09-30 LAB — URINALYSIS, ROUTINE W REFLEX MICROSCOPIC
BILIRUBIN URINE: NEGATIVE
Glucose, UA: NEGATIVE mg/dL
Hgb urine dipstick: NEGATIVE
KETONES UR: NEGATIVE mg/dL
LEUKOCYTES UA: NEGATIVE
NITRITE: NEGATIVE
PROTEIN: NEGATIVE mg/dL
Specific Gravity, Urine: 1.011 (ref 1.005–1.030)
pH: 5 (ref 5.0–8.0)

## 2016-09-30 LAB — BASIC METABOLIC PANEL
ANION GAP: 11 (ref 5–15)
BUN: 12 mg/dL (ref 6–20)
CALCIUM: 9.4 mg/dL (ref 8.9–10.3)
CO2: 25 mmol/L (ref 22–32)
Chloride: 100 mmol/L — ABNORMAL LOW (ref 101–111)
Creatinine, Ser: 0.62 mg/dL (ref 0.44–1.00)
Glucose, Bld: 78 mg/dL (ref 65–99)
Potassium: 3.9 mmol/L (ref 3.5–5.1)
SODIUM: 136 mmol/L (ref 135–145)

## 2016-09-30 LAB — PREGNANCY, URINE: Preg Test, Ur: NEGATIVE

## 2016-09-30 MED ORDER — ONDANSETRON HCL 4 MG/2ML IJ SOLN
4.0000 mg | Freq: Once | INTRAMUSCULAR | Status: AC
  Administered 2016-09-30: 4 mg via INTRAVENOUS
  Filled 2016-09-30: qty 2

## 2016-09-30 MED ORDER — GABAPENTIN 300 MG PO CAPS
300.0000 mg | ORAL_CAPSULE | Freq: Once | ORAL | Status: AC
  Administered 2016-09-30: 300 mg via ORAL
  Filled 2016-09-30: qty 1

## 2016-09-30 MED ORDER — FAMOTIDINE 20 MG PO TABS
20.0000 mg | ORAL_TABLET | Freq: Once | ORAL | Status: AC
  Administered 2016-09-30: 20 mg via ORAL
  Filled 2016-09-30: qty 1

## 2016-09-30 MED ORDER — DEXAMETHASONE SODIUM PHOSPHATE 10 MG/ML IJ SOLN
10.0000 mg | Freq: Once | INTRAMUSCULAR | Status: AC
  Administered 2016-09-30: 10 mg via INTRAVENOUS
  Filled 2016-09-30: qty 1

## 2016-09-30 MED ORDER — HYDROMORPHONE HCL 1 MG/ML IJ SOLN
0.5000 mg | Freq: Once | INTRAMUSCULAR | Status: AC
  Administered 2016-09-30: 0.5 mg via INTRAVENOUS
  Filled 2016-09-30: qty 1

## 2016-09-30 MED ORDER — GABAPENTIN 300 MG PO CAPS: 300.0000 mg | ORAL_CAPSULE | Freq: Two times a day (BID) | ORAL | 0 refills | Status: DC

## 2016-09-30 MED ORDER — HYDROCODONE-ACETAMINOPHEN 5-325 MG PO TABS: 1.0000 | ORAL_TABLET | ORAL | 0 refills | Status: DC | PRN

## 2016-09-30 MED ORDER — HYDROMORPHONE HCL 1 MG/ML IJ SOLN: 1.0000 mg | Freq: Once | INTRAMUSCULAR | Status: DC

## 2016-09-30 NOTE — ED Triage Notes (Signed)
Patient complaining of headache x 6 days. States this is her 3rd visit this week for same.

## 2016-09-30 NOTE — Discharge Instructions (Signed)
Continue taking your current home medicines.  Add the gabapentin as prescribed, taking your next dose tomorrow morning.  Your labs, exam and CT scan today are reassuring for no emergent problem tonight.  Call Dr.[place] for  further evaluation if your symptoms persist - he may be able to see you sooner than the appointment you have scheduled with the neurologist in July.

## 2016-10-02 NOTE — ED Provider Notes (Signed)
[facility] Provider Note   CSN: [id number] Arrival date & time: 09/30/16  1350     History   Chief Complaint Chief Complaint  Patient presents with  . Headache    HPI Misty Figueroa is a 21 y.o. female presenting with headache.  She has a history of migraine headache and was seen here 2 days ago, 2 visits for this complaint and received IV fluids, migraine medications and after the second visit, her classic migraine was improved.  However,  She now describes a left sided burning sensation type headache which has been present since the migraine headache resolved.  She denies fevers, chills, syncope, confusion, neck pain or stiffness, no visual changes or localized weakness.  The patient has tried excedrin migraine without relief of symptoms.   HPI  Past Medical History:  Diagnosis Date  . Abdominal pain   . GERD (gastroesophageal reflux disease)   . Premature baby    born at 4627 weeks    Patient Active Problem List   Diagnosis Date Noted  . IBS (irritable bowel syndrome) 04/21/2016  . Muscular abdominal pain in periumbilical region 02/10/2016  . Hemorrhoids, internal, with bleeding 02/10/2016  . Loss of weight 02/10/2016    Past Surgical History:  Procedure Laterality Date  . CHOLECYSTECTOMY    . COLONOSCOPY WITH PROPOFOL N/A 03/08/2016   Procedure: COLONOSCOPY WITH PROPOFOL;  Surgeon: West BaliSandi L Fields, MD;  Location: [facility];  Service: Endoscopy;  Laterality: N/A;  1230   . ESOPHAGOGASTRODUODENOSCOPY (EGD) WITH PROPOFOL N/A 03/08/2016   Procedure: ESOPHAGOGASTRODUODENOSCOPY (EGD) WITH PROPOFOL;  Surgeon: West BaliSandi L Fields, MD;  Location: [facility];  Service: Endoscopy;  Laterality: N/A;    OB History    No data available       Home Medications    Prior to Admission medications   Medication Sig Start Date End Date Taking? Authorizing Provider  aspirin-acetaminophen-caffeine (EXCEDRIN MIGRAINE) [id number] MG tablet Take 2 tablets by mouth every 6  (six) hours as needed for headache or migraine.   Yes [provider]  diphenhydrAMINE (BENADRYL) 25 mg capsule Take 50 mg by mouth every 6 (six) hours as needed. Takes for hives (autoimmune disorder)    Yes [provider]  Norethindrone-Ethinyl Estradiol-Fe Biphas (LO LOESTRIN FE) 1 MG-10 MCG / 10 MCG tablet Take 1 tablet by mouth daily.   Yes [provider]  omalizumab Geoffry Paradise(XOLAIR) 150 MG injection Inject 300 mg into the skin every 28 (twenty-eight) days.   Yes [provider]  traMADol (ULTRAM) 50 MG tablet Take 1 tablet (50 mg total) by mouth every 6 (six) hours as needed. 09/28/16  Yes Misty Figueroa, Joseph, MD  gabapentin (NEURONTIN) 300 MG capsule Take 1 capsule (300 mg total) by mouth 2 (two) times daily. 09/30/16   Burgess AmorIdol, Danyel Tobey, PA-C  HYDROcodone-acetaminophen (NORCO/VICODIN) 5-325 MG tablet Take 1 tablet by mouth every 4 (four) hours as needed. 09/30/16   Burgess AmorIdol, Nannette Zill, PA-C  pantoprazole (PROTONIX) 40 MG tablet 1 PO 30 MINS PRIOR TO MEALS TWICE DAILY. Patient not taking: Reported on 04/21/2016 03/08/16   West BaliFields, Sandi L, MD  PARoxetine (PAXIL) 20 MG tablet 1/ PO DAILY FOR 2 WEEKS AND INCREASE TO ONE TAB DAILY IF ABDOMINAL PAIN AND DIARRHEA ARE NOT IMPROVED. Patient not taking: Reported on 09/30/2016 03/12/16   West BaliFields, Sandi L, MD    Family History Family History  Problem Relation Age of Onset  . Hypertension Father   . Hypertension Maternal Grandmother   . Stroke Paternal Grandmother   .  Diabetes Paternal Grandmother   . Pulmonary fibrosis Paternal Grandfather   . Colon cancer Neg Hx   . Crohn's disease Neg Hx   . Inflammatory bowel disease Neg Hx     Social History Social History  Substance Use Topics  . Smoking status: Never Smoker  . Smokeless tobacco: Never Used  . Alcohol use No     Allergies   Morphine and related; Penicillins; and Prochlorperazine   Review of Systems Review of Systems  Constitutional: Negative for chills and fever.  HENT:  Negative for congestion and sore throat.   Eyes: Negative.  Negative for visual disturbance.  Respiratory: Negative for chest tightness and shortness of breath.   Cardiovascular: Negative for chest pain.  Gastrointestinal: Negative for nausea and vomiting.  Genitourinary: Negative.   Musculoskeletal: Negative for arthralgias, joint swelling and neck pain.  Skin: Negative.  Negative for rash and wound.  Neurological: Positive for headaches. Negative for dizziness, speech difficulty, weakness, light-headedness and numbness.  Psychiatric/Behavioral: Negative.      Physical Exam Updated Vital Signs BP 118/66   Pulse 88   Temp 98.3 F (36.8 C)   Resp 18   Ht 5\' 4"  (1.626 m)   Wt 45.8 kg (101 lb)   LMP 09/28/2016 Comment: neg u preg  SpO2 100%   BMI 17.34 kg/m   Physical Exam  Constitutional: She is oriented to person, place, and time. She appears well-developed and well-nourished.  HENT:  [place]: Normocephalic and atraumatic.  Mouth/Throat: Oropharynx is clear and moist.  Eyes: EOM are normal. Pupils are equal, round, and reactive to light.  Neck: Normal range of motion. Neck supple.  Cardiovascular: Normal rate and normal heart sounds.   Pulmonary/Chest: Effort normal.  Abdominal: Soft. There is no tenderness.  Musculoskeletal: Normal range of motion.  Lymphadenopathy:    She has no cervical adenopathy.  Neurological: She is alert and oriented to person, place, and time. She has normal strength. No sensory deficit. Gait normal. GCS eye subscore is 4. GCS verbal subscore is 5. GCS motor subscore is 6.  Normal heel-shin, normal rapid alternating movements. Cranial nerves III-XII intact.  No pronator drift.  Skin: Skin is warm and dry. No rash noted.  Psychiatric: She has a normal mood and affect. Her speech is normal and behavior is normal. Thought content normal. Cognition and memory are normal.  Nursing note and vitals reviewed.    ED Treatments / Results  Labs (all labs  ordered are listed, but only abnormal results are displayed) Labs Reviewed  BASIC METABOLIC PANEL - Abnormal; Notable for the following:       Result Value   Chloride 100 (*)    All other components within normal limits  URINALYSIS, ROUTINE W REFLEX MICROSCOPIC - Abnormal; Notable for the following:    APPearance HAZY (*)    All other components within normal limits  CBC WITH DIFFERENTIAL/PLATELET  PREGNANCY, URINE    EKG  EKG Interpretation None       Radiology Ct [place] Wo Contrast  Result Date: 09/30/2016 CLINICAL DATA:  Left temporal headache for 6 days. EXAM: CT [place] WITHOUT CONTRAST TECHNIQUE: Contiguous axial images were obtained from the base of the skull through the vertex without intravenous contrast. COMPARISON:  None. FINDINGS: Brain: The brainstem, cerebellum, cerebral peduncles, thalami, basal ganglia, basilar cisterns, and ventricular system appear within normal limits. No intracranial hemorrhage, mass lesion, or acute CVA. Vascular: Unremarkable Skull: Unremarkable Sinuses/Orbits: Unremarkable where included. Other: No supplemental non-categorized findings. IMPRESSION: 1. No  significant intracranial abnormality is identified. Electronically Signed   By: Gaylyn Rong M.D.   On: 09/30/2016 16:48    Procedures Procedures (including critical care time)  Medications Ordered in ED Medications  dexamethasone (DECADRON) injection 10 mg (10 mg Intravenous Given 09/30/16 1547)  ondansetron (ZOFRAN) injection 4 mg (4 mg Intravenous Given 09/30/16 1547)  HYDROmorphone (DILAUDID) injection 0.5 mg (0.5 mg Intravenous Given 09/30/16 1548)  gabapentin (NEURONTIN) capsule 300 mg (300 mg Oral Given 09/30/16 1548)  famotidine (PEPCID) tablet 20 mg (20 mg Oral Given 09/30/16 1658)     Initial Impression / Assessment and Plan / ED Course  I have reviewed the triage vital signs and the nursing notes.  Pertinent labs & imaging results that were available during my care of the  patient were reviewed by me and considered in my medical decision making (see chart for details).     Labs and imaging unremarkable, no evidence for intracranial abnormality.  She was given decadron and zofran, added small dose of dilaudid which she states was the only med that seemed to be helpful at last visit.  Additionally, added gabapentin po given the burning quality of pain.  I suspect this may be neuropathic pain.  She endorses improving headache at time of dc.  Pepcid given as her acid reflux began to flare after receiving the gabapentin.  She was prescribed a course of this medicine. Referral to neuro given.  She does have a tentative appt scheduled with a neurologist in [place], but cannot be seen until the end of July.  She may call for hopeful earlier appt with Dr. Gerilyn Pilgrim prn.  The patient appears reasonably screened and/or stabilized for discharge and I doubt any other medical condition or other [facility] requiring further screening, evaluation, or treatment in the ED at this time prior to discharge.  Pt discussed with Dr. Estell Harpin prior to dc home.  Final Clinical Impressions(s) / ED Diagnoses   Final diagnoses:  Other headache syndrome    New Prescriptions Discharge Medication List as of 09/30/2016  5:09 PM    START taking these medications   Details  gabapentin (NEURONTIN) 300 MG capsule Take 1 capsule (300 mg total) by mouth 2 (two) times daily., Starting Fri 09/30/2016, Print    HYDROcodone-acetaminophen (NORCO/VICODIN) 5-325 MG tablet Take 1 tablet by mouth every 4 (four) hours as needed., Starting Fri 09/30/2016, Print         Burgess Amor, PA-C 10/02/16 1610    Misty Berkshire, MD 10/02/16 [phone]

## 2017-01-31 ENCOUNTER — Emergency Department (HOSPITAL_COMMUNITY)
Admission: EM | Admit: 2017-01-31 | Discharge: 2017-01-31 | Disposition: A | Discharge status: Home / Self Care | Payer: BC Managed Care – PPO | Attending: Emergency Medicine | Admitting: Emergency Medicine

## 2017-01-31 ENCOUNTER — Encounter (HOSPITAL_COMMUNITY): Payer: Self-pay

## 2017-01-31 DIAGNOSIS — G43009 Migraine without aura, not intractable, without status migrainosus: Secondary | ICD-10-CM | POA: Insufficient documentation

## 2017-01-31 DIAGNOSIS — Z7982 Long term (current) use of aspirin: Secondary | ICD-10-CM | POA: Insufficient documentation

## 2017-01-31 DIAGNOSIS — Z79899 Other long term (current) drug therapy: Secondary | ICD-10-CM | POA: Diagnosis not present

## 2017-01-31 DIAGNOSIS — R51 Headache: Secondary | ICD-10-CM | POA: Diagnosis present

## 2017-01-31 MED ORDER — KETOROLAC TROMETHAMINE 30 MG/ML IJ SOLN
30.0000 mg | Freq: Once | INTRAMUSCULAR | Status: AC
  Administered 2017-01-31: 30 mg via INTRAVENOUS
  Filled 2017-01-31: qty 1

## 2017-01-31 MED ORDER — DIPHENHYDRAMINE HCL 50 MG/ML IJ SOLN
25.0000 mg | Freq: Once | INTRAMUSCULAR | Status: AC
  Administered 2017-01-31: 25 mg via INTRAVENOUS
  Filled 2017-01-31: qty 1

## 2017-01-31 MED ORDER — METOCLOPRAMIDE HCL 5 MG/ML IJ SOLN
10.0000 mg | Freq: Once | INTRAMUSCULAR | Status: AC
  Administered 2017-01-31: 10 mg via INTRAVENOUS
  Filled 2017-01-31: qty 2

## 2017-01-31 MED ORDER — SODIUM CHLORIDE 0.9 % IV BOLUS (SEPSIS)
1000.0000 mL | Freq: Once | INTRAVENOUS | Status: AC
  Administered 2017-01-31: 1000 mL via INTRAVENOUS

## 2017-01-31 NOTE — ED Notes (Signed)
Went to walk in clinic in [place] toradol shot without relief. Has not vomited in a few days. +nausea

## 2017-01-31 NOTE — ED Triage Notes (Signed)
Pt reports migraine headache since Thursday.  Initially was vomiting but hasn't vomited in the past 2 days.  Has taken several different OTC medications and received a toradol injection 2 days ago at [facility] but no relief.  Reports history of migraines.

## 2017-01-31 NOTE — ED Notes (Signed)
Pt eating some crackers in room and has tolerated at present

## 2017-01-31 NOTE — ED Provider Notes (Signed)
[facility] EMERGENCY DEPARTMENT Provider Note   CSN: [id number] Arrival date & time: 01/31/17  6962     History   Chief Complaint Chief Complaint  Patient presents with  . Headache    HPI Misty Figueroa is a 21 y.o. female.  The history is provided by the patient.  Headache   This is a new problem. The current episode started more than 2 days ago. The problem occurs constantly. The problem has been gradually worsening. The headache is associated with nothing. The pain is located in the left unilateral region. The pain is moderate. The pain does not radiate. Pertinent negatives include no shortness of breath. She has tried nothing for the symptoms. The treatment provided mild relief.  Pt reports she has had a headache for several days.  No relief with a torodol injection   Past Medical History:  Diagnosis Date  . Abdominal pain   . GERD (gastroesophageal reflux disease)   . Premature baby    born at 50 weeks    Patient Active Problem List   Diagnosis Date Noted  . IBS (irritable bowel syndrome) 04/21/2016  . Muscular abdominal pain in periumbilical region 02/10/2016  . Hemorrhoids, internal, with bleeding 02/10/2016  . Loss of weight 02/10/2016    Past Surgical History:  Procedure Laterality Date  . CHOLECYSTECTOMY    . COLONOSCOPY WITH PROPOFOL N/A 03/08/2016   Procedure: COLONOSCOPY WITH PROPOFOL;  Surgeon: West Bali, MD;  Location: [facility];  Service: Endoscopy;  Laterality: N/A;  1230   . ESOPHAGOGASTRODUODENOSCOPY (EGD) WITH PROPOFOL N/A 03/08/2016   Procedure: ESOPHAGOGASTRODUODENOSCOPY (EGD) WITH PROPOFOL;  Surgeon: West Bali, MD;  Location: [facility];  Service: Endoscopy;  Laterality: N/A;    OB History    No data available       Home Medications    Prior to Admission medications   Medication Sig Start Date End Date Taking? Authorizing Provider  aspirin-acetaminophen-caffeine (EXCEDRIN MIGRAINE) [phone] MG tablet Take 2  tablets by mouth every 6 (six) hours as needed for headache or migraine.   Yes [provider]  Norethindrone-Ethinyl Estradiol-Fe Biphas (LO LOESTRIN FE) 1 MG-10 MCG / 10 MCG tablet Take 1 tablet by mouth daily.   Yes [provider]  omalizumab Geoffry Paradise) 150 MG injection Inject 300 mg into the skin every 28 (twenty-eight) days.   Yes [provider]  diphenhydrAMINE (BENADRYL) 25 mg capsule Take 50 mg by mouth every 6 (six) hours as needed. Takes for hives (autoimmune disorder)     [provider]  gabapentin (NEURONTIN) 300 MG capsule Take 1 capsule (300 mg total) by mouth 2 (two) times daily. Patient not taking: Reported on 01/31/2017 09/30/16   Burgess Amor, PA-C  HYDROcodone-acetaminophen (NORCO/VICODIN) 5-325 MG tablet Take 1 tablet by mouth every 4 (four) hours as needed. Patient not taking: Reported on 01/31/2017 09/30/16   Burgess Amor, PA-C  PARoxetine (PAXIL) 20 MG tablet 1/ PO DAILY FOR 2 WEEKS AND INCREASE TO ONE TAB DAILY IF ABDOMINAL PAIN AND DIARRHEA ARE NOT IMPROVED. Patient not taking: Reported on 09/30/2016 03/12/16   West Bali, MD  traMADol (ULTRAM) 50 MG tablet Take 1 tablet (50 mg total) by mouth every 6 (six) hours as needed. Patient not taking: Reported on 01/31/2017 09/28/16   Bethann Berkshire, MD    Family History Family History  Problem Relation Age of Onset  . Hypertension Father   . Hypertension Maternal Grandmother   . Stroke Paternal Grandmother   .  Diabetes Paternal Grandmother   . Pulmonary fibrosis Paternal Grandfather   . Colon cancer Neg Hx   . Crohn's disease Neg Hx   . Inflammatory bowel disease Neg Hx     Social History Social History  Substance Use Topics  . Smoking status: Never Smoker  . Smokeless tobacco: Never Used  . Alcohol use Yes     Comment: occ     Allergies   Morphine and related; Penicillins; and Prochlorperazine   Review of Systems Review of Systems  Respiratory: Negative for shortness of  breath.   Neurological: Positive for headaches.  All other systems reviewed and are negative.    Physical Exam Updated Vital Signs BP 119/75   Pulse 93   Temp 98.7 F (37.1 C) (Oral)   Resp 16   Ht  (1.626 m)   Wt 47.2 kg (104 lb)   LMP 01/01/2017 (Approximate)   SpO2 100%   BMI 17.85 kg/m   Physical Exam  Constitutional: She appears well-developed and well-nourished.  HENT:  [place]: Normocephalic and atraumatic.  Right Ear: External ear normal.  Left Ear: External ear normal.  Nose: Nose normal.  Mouth/Throat: Oropharynx is clear and moist.  Eyes: Pupils are equal, round, and reactive to light. Conjunctivae are normal.  Neck: Normal range of motion.  Cardiovascular: Normal rate and regular rhythm.   Pulmonary/Chest: Effort normal.  Abdominal: Soft.  Musculoskeletal: Normal range of motion.  Neurological: She is alert.  Skin: Skin is warm.  Psychiatric: She has a normal mood and affect.  Nursing note and vitals reviewed.    ED Treatments / Results  Labs (all labs ordered are listed, but only abnormal results are displayed) Labs Reviewed - No data to display  EKG  EKG Interpretation None       Radiology No results found.  Procedures Procedures (including critical care time)  Medications Ordered in ED Medications  sodium chloride 0.9 % bolus 1,000 mL (0 mLs Intravenous Stopped 01/31/17 1330)  ketorolac (TORADOL) 30 MG/ML injection 30 mg (30 mg Intravenous Given 01/31/17 1127)  metoCLOPramide (REGLAN) injection 10 mg (10 mg Intravenous Given 01/31/17 1127)  diphenhydrAMINE (BENADRYL) injection 25 mg (25 mg Intravenous Given 01/31/17 1128)     Initial Impression / Assessment and Plan / ED Course  I have reviewed the triage vital signs and the nursing notes.  Pertinent labs & imaging results that were available during my care of the patient were reviewed by me and considered in my medical decision making (see chart for details).    Pt given Iv  fluid x 1 liter, torodol, benadryl and reglan.  Pt reports she feels better and wants to go home and sleep.    Final Clinical Impressions(s) / ED Diagnoses   Final diagnoses:  Migraine without aura and without status migrainosus, not intractable    New Prescriptions Discharge Medication List as of 01/31/2017  1:20 PM    An After Visit Summary was printed and given to the patient.    Elson Areas, [place] 01/31/17 1358    Marily Memos, MD 01/31/17 [phone]

## 2017-02-02 ENCOUNTER — Encounter (HOSPITAL_COMMUNITY): Payer: Self-pay | Admitting: Emergency Medicine

## 2017-02-02 ENCOUNTER — Emergency Department (HOSPITAL_COMMUNITY): Payer: BC Managed Care – PPO

## 2017-02-02 ENCOUNTER — Emergency Department (HOSPITAL_COMMUNITY)
Admission: EM | Admit: 2017-02-02 | Discharge: 2017-02-02 | Disposition: A | Discharge status: Home / Self Care | Payer: BC Managed Care – PPO | Attending: Emergency Medicine | Admitting: Emergency Medicine

## 2017-02-02 DIAGNOSIS — R51 Headache: Secondary | ICD-10-CM | POA: Diagnosis present

## 2017-02-02 DIAGNOSIS — G43801 Other migraine, not intractable, with status migrainosus: Secondary | ICD-10-CM | POA: Diagnosis not present

## 2017-02-02 DIAGNOSIS — Z79899 Other long term (current) drug therapy: Secondary | ICD-10-CM | POA: Diagnosis not present

## 2017-02-02 HISTORY — DX: Headache: R51

## 2017-02-02 HISTORY — DX: Irritable bowel syndrome, unspecified: K58.9

## 2017-02-02 HISTORY — DX: Headache, unspecified: R51.9

## 2017-02-02 MED ORDER — PROCHLORPERAZINE EDISYLATE 5 MG/ML IJ SOLN
5.0000 mg | Freq: Once | INTRAMUSCULAR | Status: AC
  Administered 2017-02-02: 5 mg via INTRAVENOUS
  Filled 2017-02-02: qty 2

## 2017-02-02 MED ORDER — PROMETHAZINE HCL 25 MG RE SUPP: 25.0000 mg | Freq: Four times a day (QID) | RECTAL | 0 refills | Status: DC | PRN

## 2017-02-02 MED ORDER — KETOROLAC TROMETHAMINE 30 MG/ML IJ SOLN
30.0000 mg | Freq: Once | INTRAMUSCULAR | Status: AC
  Administered 2017-02-02: 30 mg via INTRAVENOUS
  Filled 2017-02-02: qty 1

## 2017-02-02 MED ORDER — SODIUM CHLORIDE 0.9 % IV BOLUS (SEPSIS)
500.0000 mL | Freq: Once | INTRAVENOUS | Status: AC
  Administered 2017-02-02: 500 mL via INTRAVENOUS

## 2017-02-02 MED ORDER — BUTALBITAL-APAP-CAFF-COD 50-300-40-30 MG PO CAPS: ORAL_CAPSULE | ORAL | 0 refills | Status: DC

## 2017-02-02 MED ORDER — FENTANYL CITRATE (PF) 100 MCG/2ML IJ SOLN
50.0000 ug | Freq: Once | INTRAMUSCULAR | Status: AC
  Administered 2017-02-02: 50 ug via INTRAVENOUS
  Filled 2017-02-02: qty 2

## 2017-02-02 MED ORDER — PROMETHAZINE HCL 25 MG/ML IJ SOLN
12.5000 mg | Freq: Once | INTRAMUSCULAR | Status: AC
  Administered 2017-02-02: 12.5 mg via INTRAVENOUS
  Filled 2017-02-02: qty 1

## 2017-02-02 MED ORDER — DIPHENHYDRAMINE HCL 50 MG/ML IJ SOLN
25.0000 mg | Freq: Once | INTRAMUSCULAR | Status: AC
  Administered 2017-02-02: 25 mg via INTRAVENOUS
  Filled 2017-02-02: qty 1

## 2017-02-02 NOTE — ED Triage Notes (Signed)
Headache and pain behind lt eye x one week.  Seen here 2 days ago.  Felt better after leaving, but the pain came back.

## 2017-02-02 NOTE — Discharge Instructions (Addendum)
Our vital signs within normal limits. There no gross neurologic deficits appreciated on your examination. The CT scan of your [place] is negative for acute problem. Please call Dr. Neale BurlyFreeman for evaluation of your headaches by headache specialist. Please use Butalbital- codeine for headaches not improved by Tylenol or ibuprofen. Use phenergan for nausea or vomiting. This medication may cause drowsiness. Please do not drink, drive, or participate in activity that requires concentration while taking this medication.

## 2017-02-02 NOTE — ED Provider Notes (Signed)
[facility] EMERGENCY DEPARTMENT Provider Note   CSN: [id number] Arrival date & time: 02/02/17  1540     History   Chief Complaint Chief Complaint  Patient presents with  . Headache    HPI Misty Figueroa is a 21 y.o. female.  Patient is a 21 year oldfemale who presents to the emergency department with complaint of headache.  The patient states she has a history of migraine headaches. She is not on any maintenance medication for the headaches. The headaches have been off and on for nearly a week. She was seen here 2 days ago for migraine headache she was treated with migraine cannot tell, felt better when she left, but the pain came back. She has had nausea, but no actual vomiting. She  States she has some blurring of her vision. She complains of pain behind the left eye, which she says is different from her usual migraine headache. She also states that the intensity of this headache is worse than previous headaches. No recent injury or trauma to the [place]. No recent changes in diet or environment.      Past Medical History:  Diagnosis Date  . Abdominal pain   . GERD (gastroesophageal reflux disease)   . Headache   . IBS (irritable bowel syndrome)   . Premature baby    born at 89 weeks    Patient Active Problem List   Diagnosis Date Noted  . IBS (irritable bowel syndrome) 04/21/2016  . Muscular abdominal pain in periumbilical region 02/10/2016  . Hemorrhoids, internal, with bleeding 02/10/2016  . Loss of weight 02/10/2016    Past Surgical History:  Procedure Laterality Date  . CHOLECYSTECTOMY    . COLONOSCOPY WITH PROPOFOL N/A 03/08/2016   Procedure: COLONOSCOPY WITH PROPOFOL;  Surgeon: West Bali, MD;  [facility]: [facility];  Service: Endoscopy;  Laterality: N/A;  1230   . ESOPHAGOGASTRODUODENOSCOPY (EGD) WITH PROPOFOL N/A 03/08/2016   Procedure: ESOPHAGOGASTRODUODENOSCOPY (EGD) WITH PROPOFOL;  Surgeon: West Bali, MD;  Location: [facility];  Service:  Endoscopy;  Laterality: N/A;    OB History    No data available       Home Medications    Prior to Admission medications   Medication Sig Start Date End Date Taking? Authorizing Provider  aspirin-acetaminophen-caffeine (EXCEDRIN MIGRAINE) [phone] MG tablet Take 2 tablets by mouth every 6 (six) hours as needed for headache or migraine.    [provider]  diphenhydrAMINE (BENADRYL) 25 mg capsule Take 50 mg by mouth every 6 (six) hours as needed. Takes for hives (autoimmune disorder)     [provider]  gabapentin (NEURONTIN) 300 MG capsule Take 1 capsule (300 mg total) by mouth 2 (two) times daily. Patient not taking: Reported on 01/31/2017 09/30/16   Burgess Amor, PA-C  HYDROcodone-acetaminophen (NORCO/VICODIN) 5-325 MG tablet Take 1 tablet by mouth every 4 (four) hours as needed. Patient not taking: Reported on 01/31/2017 09/30/16   Burgess Amor, PA-C  Norethindrone-Ethinyl Estradiol-Fe Biphas (LO LOESTRIN FE) 1 MG-10 MCG / 10 MCG tablet Take 1 tablet by mouth daily.    [provider]  omalizumab Geoffry Paradise) 150 MG injection Inject 300 mg into the skin every 28 (twenty-eight) days.    [provider]  PARoxetine (PAXIL) 20 MG tablet 1/ PO DAILY FOR 2 WEEKS AND INCREASE TO ONE TAB DAILY IF ABDOMINAL PAIN AND DIARRHEA ARE NOT IMPROVED. Patient not taking: Reported on 09/30/2016 03/12/16   West Bali, MD  traMADol (ULTRAM) 50 MG tablet Take 1  tablet (50 mg total) by mouth every 6 (six) hours as needed. Patient not taking: Reported on 01/31/2017 09/28/16   Bethann Berkshire, MD    Family History Family History  Problem Relation Age of Onset  . Hypertension Father   . Hypertension Maternal Grandmother   . Stroke Paternal Grandmother   . Diabetes Paternal Grandmother   . Pulmonary fibrosis Paternal Grandfather   . Colon cancer Neg Hx   . Crohn's disease Neg Hx   . Inflammatory bowel disease Neg Hx     Social History Social History  Substance Use  Topics  . Smoking status: Never Smoker  . Smokeless tobacco: Never Used  . Alcohol use Yes     Comment: occ     Allergies   Morphine and related; Penicillins; and Prochlorperazine   Review of Systems Review of Systems  Constitutional: Negative for activity change and fever.       All ROS Neg except as noted in HPI  HENT: Negative for nosebleeds.   Eyes: Negative for photophobia and discharge.       Blurring of vision  Respiratory: Negative for cough, shortness of breath and wheezing.   Cardiovascular: Negative for chest pain and palpitations.  Gastrointestinal: Positive for nausea. Negative for abdominal pain and blood in stool.  Genitourinary: Negative for dysuria, frequency and hematuria.  Musculoskeletal: Negative for arthralgias, back pain, neck pain and neck stiffness.  Skin: Negative.   Neurological: Positive for headaches. Negative for dizziness, seizures and speech difficulty.  Psychiatric/Behavioral: Negative for confusion and hallucinations.     Physical Exam Updated Vital Signs BP 111/65 (BP Location: Right Arm)   Pulse 80   Temp 98.7 F (37.1 C) (Oral)   Resp 19   Ht 5\' 4"  (1.626 m)   Wt 47.2 kg (104 lb)   LMP 01/03/2017 (Approximate)   SpO2 99%   BMI 17.85 kg/m   Physical Exam  Constitutional: She appears well-developed and well-nourished. No distress.  HENT:  [place]: Normocephalic and atraumatic.  Right Ear: External ear normal.  Left Ear: External ear normal.  Eyes: Conjunctivae are normal. Right eye exhibits no discharge. Left eye exhibits no discharge. No scleral icterus.  Neck: Neck supple. No tracheal deviation present.  Cardiovascular: Normal rate, regular rhythm and intact distal pulses.   Pulmonary/Chest: Effort normal and breath sounds normal. No stridor. No respiratory distress. She has no wheezes. She has no rales.  Abdominal: Soft. Bowel sounds are normal. She exhibits no distension. There is no tenderness. There is no rebound and no  guarding.  Musculoskeletal: She exhibits no edema or tenderness.  Neurological: She is alert. She has normal strength. No cranial nerve deficit (no facial droop, extraocular movements intact, no slurred speech) or sensory deficit. She exhibits normal muscle tone. She displays no seizure activity. Coordination normal.  Skin: Skin is warm and dry. No rash noted.  Psychiatric: She has a normal mood and affect.  Nursing note and vitals reviewed.    ED Treatments / Results  Labs (all labs ordered are listed, but only abnormal results are displayed) Labs Reviewed - No data to display  EKG  EKG Interpretation None       Radiology No results found.  Procedures Procedures (including critical care time)  Medications Ordered in ED Medications  sodium chloride 0.9 % bolus 500 mL (not administered)  promethazine (PHENERGAN) injection 12.5 mg (not administered)  ketorolac (TORADOL) 30 MG/ML injection 30 mg (not administered)  diphenhydrAMINE (BENADRYL) injection 25 mg (not administered)  Initial Impression / Assessment and Plan / ED Course  I have reviewed the triage vital signs and the nursing notes.  Pertinent labs & imaging results that were available during my care of the patient were reviewed by me and considered in my medical decision making (see chart for details).       Final Clinical Impressions(s) / ED Diagnoses MDM Vital signs within normal limits.no gross neurologic deficit appreciated on examination.  Patient given migraine cocktail.  Recheck. Patient states she got no relief from the cocktail, and feels like actually the pain may be worse. Will obtain a CT [place] scan.  CT [place] scan is negative for any acute issue. Patient treated with Compazine and fentanyl.   Final diagnoses:  Other migraine with status migrainosus, not intractable    New Prescriptions New Prescriptions   BUTALBITAL-APAP-CAFF-COD 50-300-40-30 MG CAPS    1 po q6h prn headache    PROMETHAZINE (PHENERGAN) 25 MG SUPPOSITORY    Place 1 suppository (25 mg total) rectally every 6 (six) hours as needed for nausea or vomiting.     Ivery QualeBryant, Nechemia Chiappetta, PA-C 02/02/17 2307    Samuel JesterMcManus, Kathleen, DO 02/04/17 1737

## 2017-02-02 NOTE — ED Notes (Signed)
Patient transported to CT 

## 2018-04-15 ENCOUNTER — Emergency Department (HOSPITAL_COMMUNITY)
Admission: EM | Admit: 2018-04-15 | Discharge: 2018-04-15 | Disposition: A | Discharge status: Home / Self Care | Payer: BC Managed Care – PPO | Attending: Emergency Medicine | Admitting: Emergency Medicine

## 2018-04-15 ENCOUNTER — Other Ambulatory Visit: Payer: Self-pay

## 2018-04-15 ENCOUNTER — Encounter (HOSPITAL_COMMUNITY): Payer: Self-pay | Admitting: Emergency Medicine

## 2018-04-15 DIAGNOSIS — Z79899 Other long term (current) drug therapy: Secondary | ICD-10-CM | POA: Insufficient documentation

## 2018-04-15 DIAGNOSIS — G4489 Other headache syndrome: Secondary | ICD-10-CM | POA: Diagnosis not present

## 2018-04-15 DIAGNOSIS — R51 Headache: Secondary | ICD-10-CM | POA: Diagnosis present

## 2018-04-15 MED ORDER — DEXAMETHASONE SODIUM PHOSPHATE 10 MG/ML IJ SOLN
5.0000 mg | Freq: Once | INTRAMUSCULAR | Status: AC
  Administered 2018-04-15: 5 mg via INTRAVENOUS
  Filled 2018-04-15: qty 1

## 2018-04-15 MED ORDER — DIPHENHYDRAMINE HCL 50 MG/ML IJ SOLN
50.0000 mg | Freq: Once | INTRAMUSCULAR | Status: AC
  Administered 2018-04-15: 50 mg via INTRAVENOUS
  Filled 2018-04-15: qty 1

## 2018-04-15 MED ORDER — METOCLOPRAMIDE HCL 5 MG/ML IJ SOLN
10.0000 mg | Freq: Once | INTRAMUSCULAR | Status: AC
  Administered 2018-04-15: 10 mg via INTRAVENOUS
  Filled 2018-04-15: qty 2

## 2018-04-15 NOTE — ED Triage Notes (Signed)
Patient has had migraine headache x 3 days, has taken Excedrin for migraines and Maxalt without any relief.

## 2018-04-15 NOTE — ED Provider Notes (Signed)
[facility] Provider Note   CSN: [id number] Arrival date & time: 04/15/18  0255     History   Chief Complaint Chief Complaint  Patient presents with  . Migraine    HPI Misty DialsStephanie Figueroa is a 22 y.o. female.  The history is provided by the patient.  Migraine  This is a new problem. The current episode started more than 2 days ago. The problem occurs daily. The problem has been gradually worsening. Associated symptoms include headaches. Nothing aggravates the symptoms. Nothing relieves the symptoms.   PT With history of migraines, GERD presents with headache for 3 days.  Similar to prior episodes of migraine.  It is felt throughout her [place].  She has had nausea vomiting.  No fevers.  No focal weakness.  No syncope.  She does report hearing and visual disturbances which are typical for her migraines. Tried prescribed medications without improvement Past Medical History:  Diagnosis Date  . Abdominal pain   . GERD (gastroesophageal reflux disease)   . Headache   . IBS (irritable bowel syndrome)   . Premature baby    born at 7927 weeks    Patient Active Problem List   Diagnosis Date Noted  . IBS (irritable bowel syndrome) 04/21/2016  . Muscular abdominal pain in periumbilical region 02/10/2016  . Hemorrhoids, internal, with bleeding 02/10/2016  . Loss of weight 02/10/2016    Past Surgical History:  Procedure Laterality Date  . CHOLECYSTECTOMY    . COLONOSCOPY WITH PROPOFOL N/A 03/08/2016   Procedure: COLONOSCOPY WITH PROPOFOL;  Surgeon: West BaliSandi L Fields, MD;  [facility]: [facility];  Service: Endoscopy;  Laterality: N/A;  1230   . ESOPHAGOGASTRODUODENOSCOPY (EGD) WITH PROPOFOL N/A 03/08/2016   Procedure: ESOPHAGOGASTRODUODENOSCOPY (EGD) WITH PROPOFOL;  Surgeon: West BaliSandi L Fields, MD;  Location: [facility];  Service: Endoscopy;  Laterality: N/A;     OB History   No obstetric history on file.      Home Medications    Prior to Admission medications    Medication Sig Start Date End Date Taking? Authorizing Provider  aspirin-acetaminophen-caffeine (EXCEDRIN MIGRAINE) [phone] MG tablet Take 2 tablets by mouth every 6 (six) hours as needed for headache or migraine.   Yes [provider]  citalopram (CELEXA) 10 MG tablet Take 10 mg by mouth daily.   Yes [provider]  diphenhydrAMINE (BENADRYL) 25 mg capsule Take 50 mg by mouth every 6 (six) hours as needed. Takes for hives (autoimmune disorder)    Yes [provider]  midodrine (PROAMATINE) 2.5 MG tablet Take 2.5 mg by mouth 3 (three) times daily with meals.   Yes [provider]  norgestimate-ethinyl estradiol (ORTHO-CYCLEN,SPRINTEC,PREVIFEM) 0.25-35 MG-MCG tablet Take 1 tablet by mouth daily.   Yes [provider]  omalizumab Geoffry Paradise(XOLAIR) 150 MG injection Inject 300 mg into the skin every 28 (twenty-eight) days.   Yes [provider]  zonisamide (ZONEGRAN) 25 MG capsule Take 25 mg by mouth daily. 4 Capsules once a day.   Yes [provider]  Butalbital-APAP-Caff-Cod 50-300-40-30 MG CAPS 1 po q6h prn headache 02/02/17   Ivery QualeBryant, Hobson, PA-C  gabapentin (NEURONTIN) 300 MG capsule Take 1 capsule (300 mg total) by mouth 2 (two) times daily. Patient not taking: Reported on 01/31/2017 09/30/16   Burgess AmorIdol, Julie, PA-C  HYDROcodone-acetaminophen (NORCO/VICODIN) 5-325 MG tablet Take 1 tablet by mouth every 4 (four) hours as needed. Patient not taking: Reported on 01/31/2017 09/30/16   Burgess AmorIdol, Julie, PA-C  Norethindrone-Ethinyl Estradiol-Fe Biphas (LO LOESTRIN FE)  1 MG-10 MCG / 10 MCG tablet Take 1 tablet by mouth daily.    [provider]  PARoxetine (PAXIL) 20 MG tablet 1/ PO DAILY FOR 2 WEEKS AND INCREASE TO ONE TAB DAILY IF ABDOMINAL PAIN AND DIARRHEA ARE NOT IMPROVED. Patient not taking: Reported on 09/30/2016 03/12/16   West BaliFields, Sandi L, MD  promethazine (PHENERGAN) 25 MG suppository Place 1 suppository (25 mg total) rectally every 6 (six)  hours as needed for nausea or vomiting. 02/02/17   Ivery QualeBryant, Hobson, PA-C  traMADol (ULTRAM) 50 MG tablet Take 1 tablet (50 mg total) by mouth every 6 (six) hours as needed. Patient not taking: Reported on 01/31/2017 09/28/16   Bethann BerkshireZammit, Joseph, MD    Family History Family History  Problem Relation Age of Onset  . Hypertension Father   . Hypertension Maternal Grandmother   . Stroke Paternal Grandmother   . Diabetes Paternal Grandmother   . Pulmonary fibrosis Paternal Grandfather   . Colon cancer Neg Hx   . Crohn's disease Neg Hx   . Inflammatory bowel disease Neg Hx     Social History Social History   Tobacco Use  . Smoking status: Never Smoker  . Smokeless tobacco: Never Used  Substance Use Topics  . Alcohol use: Yes    Comment: occ  . Drug use: No     Allergies   Morphine and related; Penicillins; and Prochlorperazine   Review of Systems Review of Systems  Constitutional: Negative for fever.  Gastrointestinal: Positive for nausea.  Neurological: Positive for headaches.  All other systems reviewed and are negative.    Physical Exam Updated Vital Signs BP 107/80 (BP Location: Left Arm)   Pulse (!) 115   Temp 98 F (36.7 C) (Oral)   Resp 16   Ht 1.626 m (5\' 4" )   Wt 47.6 kg   LMP 04/10/2018   SpO2 97%   BMI 18.02 kg/m   Physical Exam CONSTITUTIONAL: Well developed/well nourished [place]: Normocephalic/atraumatic EYES: EOMI/PERRL, no nystagmus, no ptosis ENMT: Mucous membranes moist NECK: supple no meningeal signs, no bruits SPINE/BACK:entire spine nontender CV: S1/S2 noted, no murmurs/rubs/gallops noted LUNGS: Lungs are clear to auscultation bilaterally, no apparent distress ABDOMEN: soft, nontender, no rebound or guarding GU:no cva tenderness NEURO:Awake/alert, face symmetric, no arm or leg drift is noted Equal 5/5 strength with shoulder abduction, elbow flex/extension, wrist flex/extension in upper extremities Cranial nerves 3/4/5/6/10/24/08/11/12 tested  and intact Gait normal without ataxia No past pointing Sensation to light touch intact in all extremities EXTREMITIES: pulses normal, full ROM SKIN: warm, color normal PSYCH: no abnormalities of mood noted, alert and oriented to situation    ED Treatments / Results  Labs (all labs ordered are listed, but only abnormal results are displayed) Labs Reviewed - No data to display  EKG None  Radiology No results found.  Procedures Procedures (including critical care time)  Medications Ordered in ED Medications  metoCLOPramide (REGLAN) injection 10 mg (10 mg Intravenous Given 04/15/18 0337)  diphenhydrAMINE (BENADRYL) injection 50 mg (50 mg Intravenous Given 04/15/18 0343)  dexamethasone (DECADRON) injection 5 mg (5 mg Intravenous Given 04/15/18 0340)     Initial Impression / Assessment and Plan / ED Course  I have reviewed the triage vital signs and the nursing notes.       4:08 AM A long history of migraines presents with typical migraine.  No focal neuro deficits.  She is requesting Dilaudid.  I advised we will start with nonnarcotic medications 4:45 AM Pt improved HA at manageable  level Advised PCP followup   Final Clinical Impressions(s) / ED Diagnoses   Final diagnoses:  Other headache syndrome    ED Discharge Orders    None       Zadie Rhine, MD 04/15/18 [phone]

## 2018-04-15 NOTE — Discharge Instructions (Addendum)

## 2018-06-03 IMAGING — CT CT ABD-PELV W/ CM
2 of 4 series · 15 of 46 positions shown, 17 images · IV contrast (iopamidol)
Comparison: None.

CLINICAL DATA: Abdominal pain, nausea and bright red blood in
stools for past week

EXAM:
CT ABDOMEN AND PELVIS WITH CONTRAST
TECHNIQUE: Multidetector CT imaging of the abdomen and pelvis was performed
using the standard protocol following bolus administration of
intravenous contrast.
CONTRAST:  100mL [id number] IOPAMIDOL ([id number]) INJECTION 61%

[Series 2: axial st · axial · 0.62mm/px · z∈[-350,+30]mm · 12 of 87 slices shown, 14 images]
[im 7/87  soft-tissue]
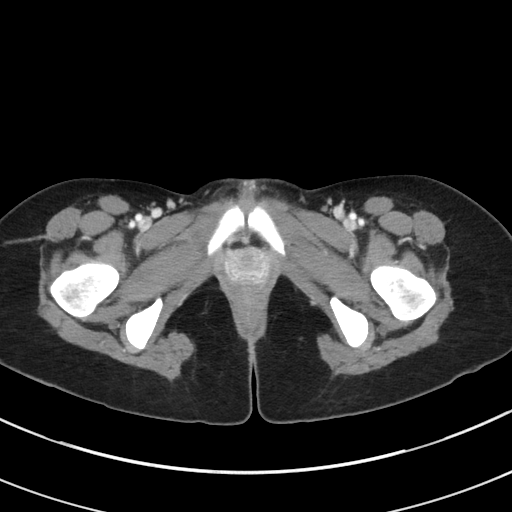
[im 7/87  bone]
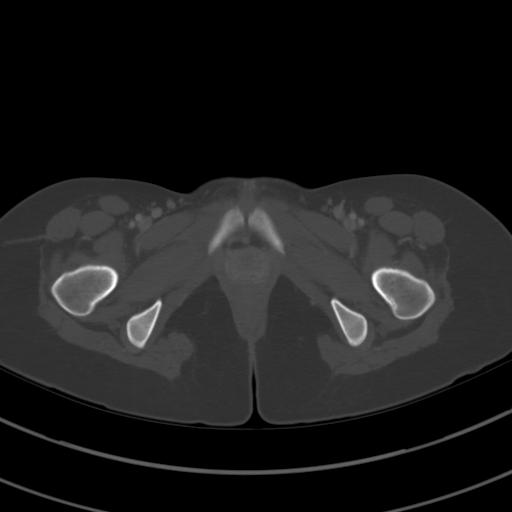
[im 14/87  soft-tissue]
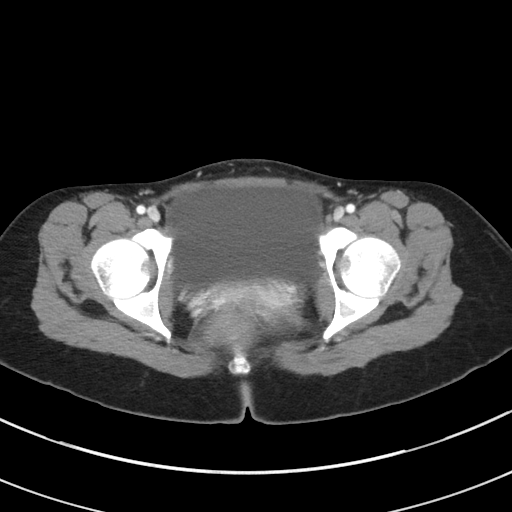
[im 21/87  soft-tissue]
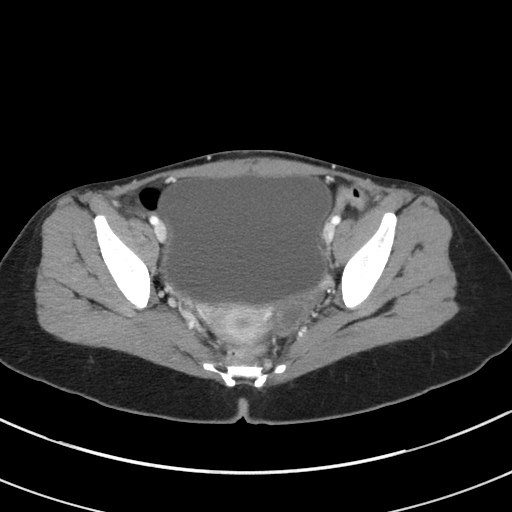
[im 28/87  soft-tissue]
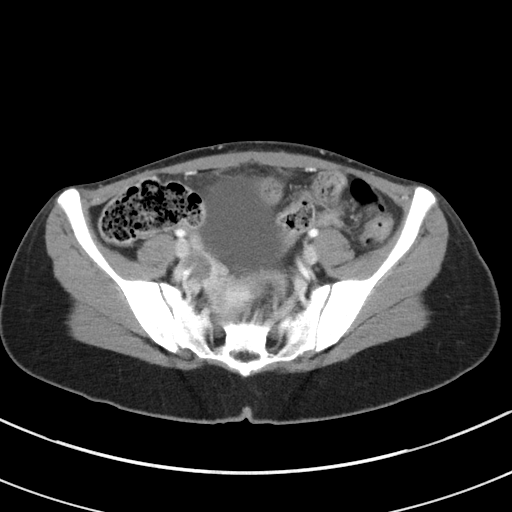
[im 35/87  soft-tissue]
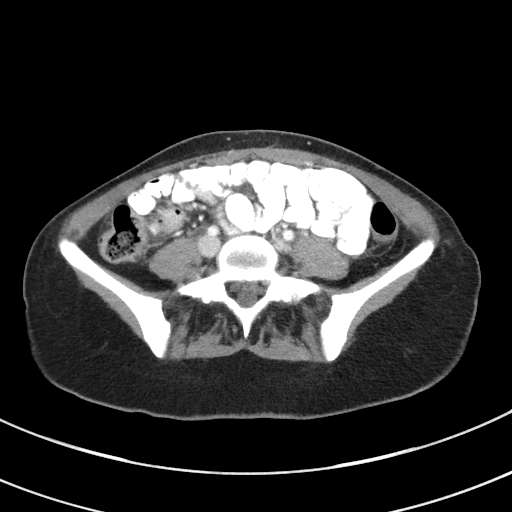
[im 42/87  soft-tissue]
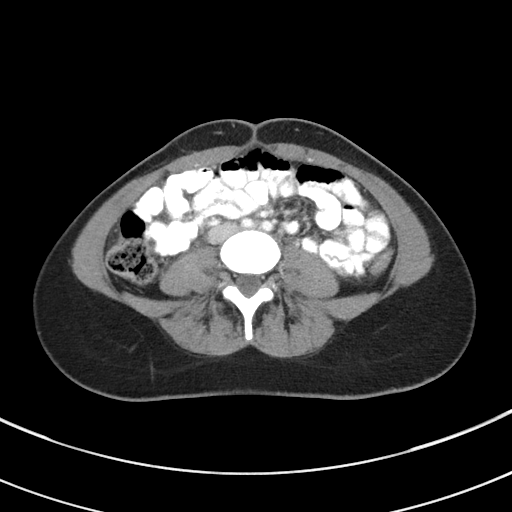
[im 49/87  soft-tissue]
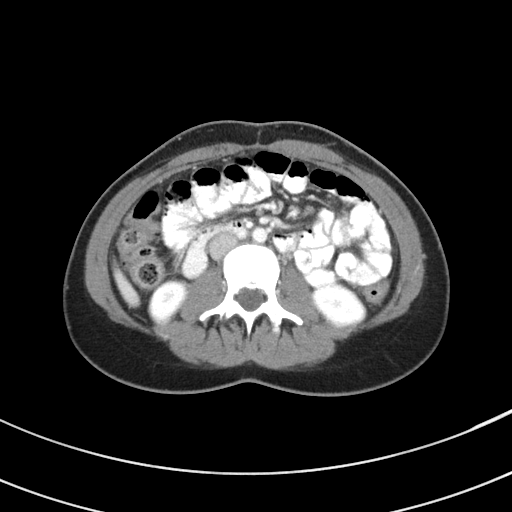
[im 56/87  soft-tissue]
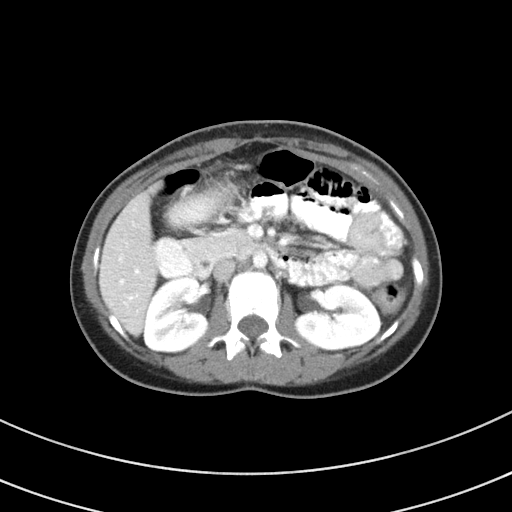
[im 62/87  soft-tissue]
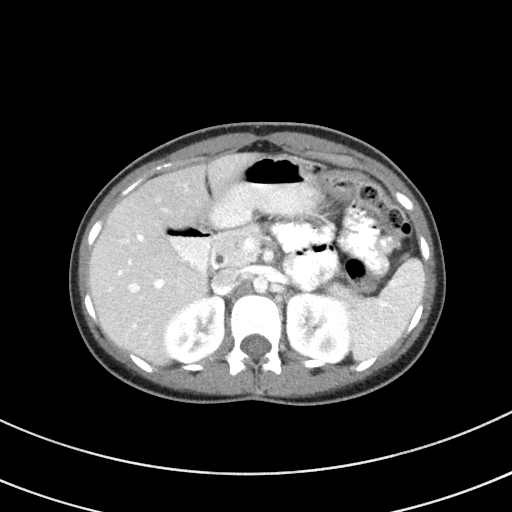
[im 62/87  bone]
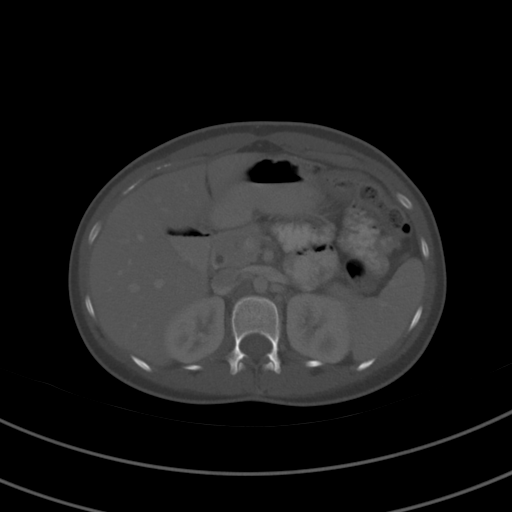
[im 69/87  soft-tissue]
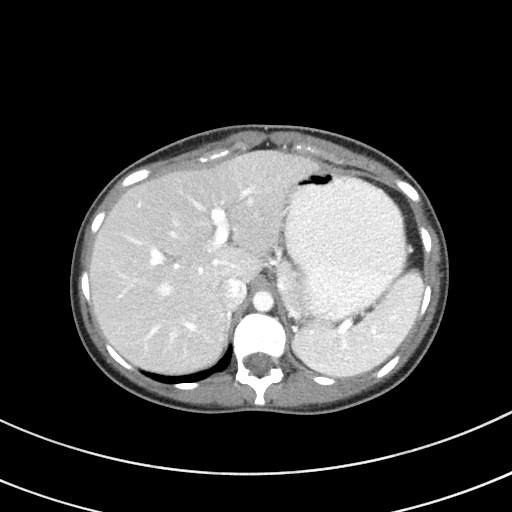
[im 76/87  soft-tissue]
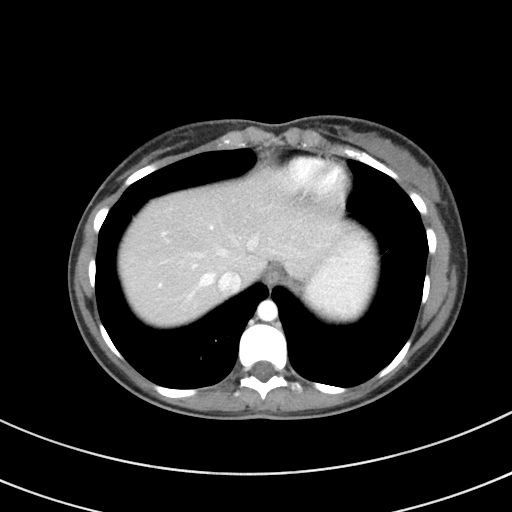
[im 83/87  soft-tissue]
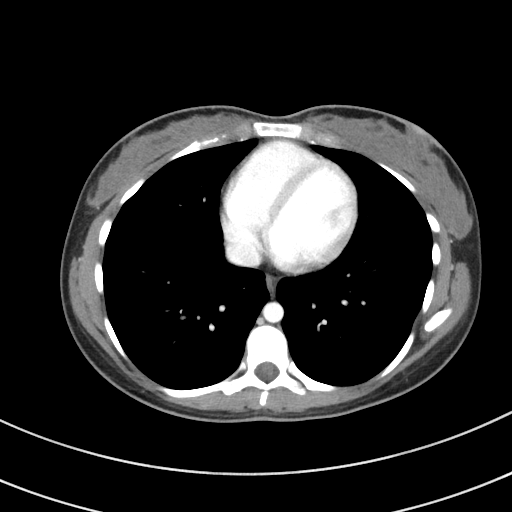

[Series 4: coronal st · coronal · 0.55mm/px · 3 of 63 slices shown]
[im 21/63  soft-tissue]
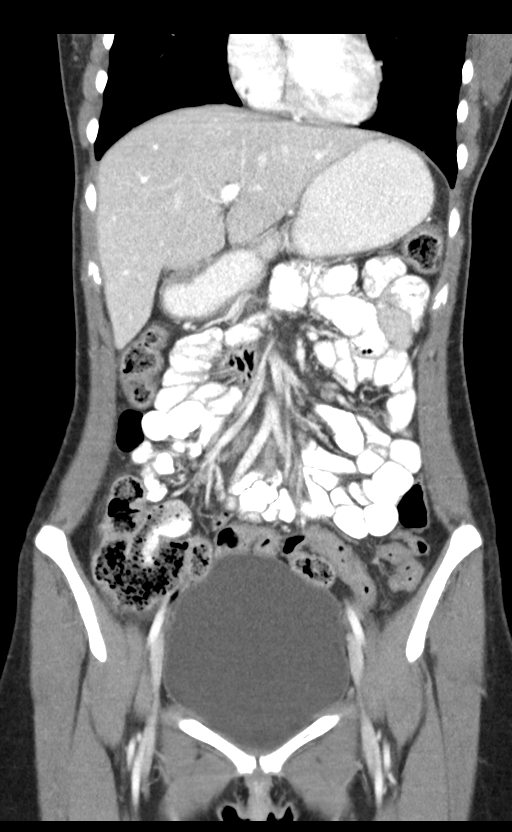
[im 28/63  soft-tissue]
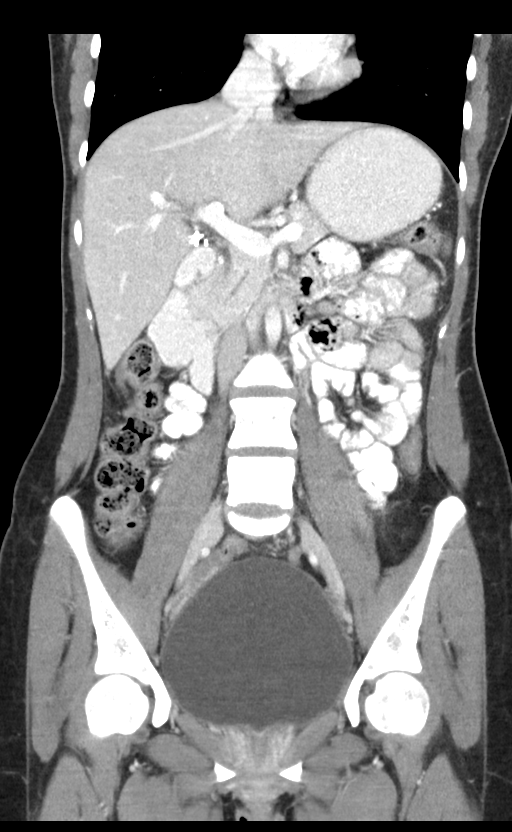
[im 35/63  soft-tissue]
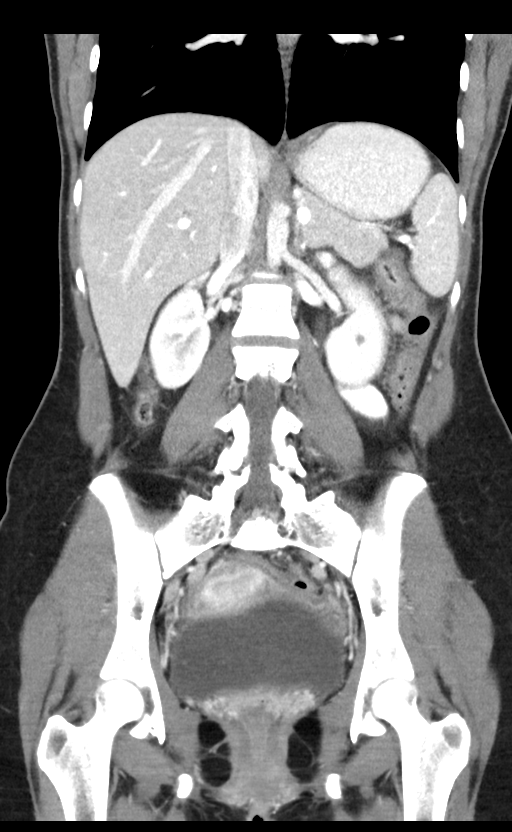

[15 of 46 positions shown; findings below may reference images not displayed]

FINDINGS: LOWER CHEST: Lung bases are clear. Included heart size is normal. No
pericardial effusion.

HEPATOBILIARY: Liver enhances homogeneously without space-occupying
mass. Cholecystectomy. No ductal dilatation.

PANCREAS: Normal.

SPLEEN: Normal.

ADRENALS/URINARY TRACT: Kidneys are orthotopic, demonstrating
symmetric enhancement. No nephrolithiasis, hydronephrosis or solid
renal masses. The unopacified ureters are normal in course and
caliber. Delayed imaging through the kidneys demonstrates symmetric
prompt contrast excretion within the proximal urinary collecting
system. Urinary bladder is physiologically distended but otherwise
unremarkable. Normal adrenal glands.

STOMACH/BOWEL: The stomach, small and large bowel are normal in
course. There is a moderate amount of fecal residue throughout large
bowel. There is a transmural thickening of descending colon
suspicious for colitis, series 4, image 36 and 37. Lack of oral
contrast in this region limits further assessment.

VASCULAR/LYMPHATIC: Aortoiliac vessels are normal in course and
caliber. No lymphadenopathy by CT size criteria.

REPRODUCTIVE: Normal.  Follicles noted both ovaries.

OTHER: No intraperitoneal free fluid or free air.

MUSCULOSKELETAL: Nonacute.
IMPRESSION: Findings suspicious for mild colitis of the descending colon.

## 2020-06-19 LAB — VITAMIN B12: Vitamin B-12: 490

## 2020-06-19 LAB — BASIC METABOLIC PANEL
BUN: 13 (ref 4–21)
Creatinine: 0.7 (ref 0.5–1.1)
Potassium: 4.2 (ref 3.4–5.3)

## 2020-06-19 LAB — VITAMIN D 25 HYDROXY (VIT D DEFICIENCY, FRACTURES): Vit D, 25-Hydroxy: 13

## 2020-06-19 LAB — HEPATIC FUNCTION PANEL
ALT: 14 (ref 7–35)
AST: 13 (ref 13–35)

## 2020-06-19 LAB — TSH: TSH: 1.13 (ref 0.41–5.90)

## 2020-08-25 ENCOUNTER — Emergency Department (HOSPITAL_COMMUNITY)
Admission: EM | Admit: 2020-08-25 | Discharge: 2020-08-25 | Disposition: A | Discharge status: Home / Self Care | Payer: BC Managed Care – PPO | Attending: Emergency Medicine | Admitting: Emergency Medicine

## 2020-08-25 ENCOUNTER — Other Ambulatory Visit: Payer: Self-pay

## 2020-08-25 ENCOUNTER — Encounter (HOSPITAL_COMMUNITY): Payer: Self-pay | Admitting: *Deleted

## 2020-08-25 DIAGNOSIS — R000 Tachycardia, unspecified: Secondary | ICD-10-CM | POA: Insufficient documentation

## 2020-08-25 DIAGNOSIS — I959 Hypotension, unspecified: Secondary | ICD-10-CM | POA: Diagnosis present

## 2020-08-25 DIAGNOSIS — I9589 Other hypotension: Secondary | ICD-10-CM

## 2020-08-25 HISTORY — DX: Fibromyalgia: M79.7

## 2020-08-25 LAB — CBC WITH DIFFERENTIAL/PLATELET
Abs Immature Granulocytes: 0.01 10*3/uL (ref 0.00–0.07)
Basophils Absolute: 0 10*3/uL (ref 0.0–0.1)
Basophils Relative: 1 %
Eosinophils Absolute: 0.1 10*3/uL (ref 0.0–0.5)
Eosinophils Relative: 3 %
HCT: 42.2 % (ref 36.0–46.0)
Hemoglobin: 13.9 g/dL (ref 12.0–15.0)
Immature Granulocytes: 0 %
Lymphocytes Relative: 35 %
Lymphs Abs: 1.9 10*3/uL (ref 0.7–4.0)
MCH: 29.8 pg (ref 26.0–34.0)
MCHC: 32.9 g/dL (ref 30.0–36.0)
MCV: 90.6 fL (ref 80.0–100.0)
Monocytes Absolute: 0.4 10*3/uL (ref 0.1–1.0)
Monocytes Relative: 6 %
Neutro Abs: 3.1 10*3/uL (ref 1.7–7.7)
Neutrophils Relative %: 55 %
Platelets: 243 10*3/uL (ref 150–400)
RBC: 4.66 MIL/uL (ref 3.87–5.11)
RDW: 12.2 % (ref 11.5–15.5)
WBC: 5.6 10*3/uL (ref 4.0–10.5)
nRBC: 0 % (ref 0.0–0.2)

## 2020-08-25 LAB — BASIC METABOLIC PANEL
Anion gap: 4 — ABNORMAL LOW (ref 5–15)
BUN: 12 mg/dL (ref 6–20)
CO2: 24 mmol/L (ref 22–32)
Calcium: 8.7 mg/dL — ABNORMAL LOW (ref 8.9–10.3)
Chloride: 107 mmol/L (ref 98–111)
Creatinine, Ser: 0.69 mg/dL (ref 0.44–1.00)
GFR, Estimated: >60 mL/min (ref >60)
Glucose, Bld: 91 mg/dL (ref 70–99)
Potassium: 3.8 mmol/L (ref 3.5–5.1)
Sodium: 135 mmol/L (ref 135–145)

## 2020-08-25 LAB — D-DIMER, QUANTITATIVE: D-Dimer, Quant: 0.27 ug/mL-FEU (ref 0.00–0.50)

## 2020-08-25 LAB — PREGNANCY, URINE: Preg Test, Ur: NEGATIVE

## 2020-08-25 LAB — URINALYSIS, ROUTINE W REFLEX MICROSCOPIC
Bilirubin Urine: NEGATIVE
Glucose, UA: NEGATIVE mg/dL
Hgb urine dipstick: NEGATIVE
Ketones, ur: NEGATIVE mg/dL
Leukocytes,Ua: NEGATIVE
Nitrite: NEGATIVE
Protein, ur: NEGATIVE mg/dL
Specific Gravity, Urine: 1.017 (ref 1.005–1.030)
pH: 6 (ref 5.0–8.0)

## 2020-08-25 MED ORDER — SODIUM CHLORIDE 0.9 % IV BOLUS
1000.0000 mL | Freq: Once | INTRAVENOUS | Status: AC
  Administered 2020-08-25: 1000 mL via INTRAVENOUS

## 2020-08-25 NOTE — ED Notes (Signed)
Called lab to add-on d-dimer order.

## 2020-08-25 NOTE — ED Triage Notes (Signed)
Patient states she has episodes of low blood pressure for the last week, also c/o dizziness

## 2020-08-25 NOTE — ED Notes (Signed)
Pt given apple juice and tolerating well °

## 2020-08-25 NOTE — ED Provider Notes (Signed)
Emergency Medicine Provider Triage Evaluation Note  Misty Figueroa , a 25 y.o. female  was evaluated in triage.  Pt requesting evaluation for hypotension.  States that she has been having episodes of low blood pressure for 1 week.  Blood pressure at home this morning was 80/59.  She describes having near syncopal episodes on occasion with sudden movement or standing.  States that she sees "black spots" and her vision goes black.  This is happened several times.  She does admit to having a syncopal episode 1 month ago.  Describes a 30 pound weight loss in 3 to 4 months.  Has been evaluated for this by her PCP.  Denies any drug use, vomiting or diarrhea, fever or chills.  No chest pain or shortness of breath.  Review of Systems  Positive: Low blood pressure, dizziness Negative: Vomiting, diarrhea, chest pain or shortness of breath  Physical Exam  BP 104/71   Pulse (!) 103   Temp 98.3 F (36.8 C) (Oral)   Resp 20   SpO2 97%  Gen:   Awake, no distress   Resp:  Normal effort  MSK:   Moves extremities without difficulty  Other:  Mildly tachycardic, abdomen soft nontender  Medical Decision Making  Medically screening exam initiated at 12:17 PM.  Appropriate orders placed.  [name] was informed that the remainder of the evaluation will be completed by another provider, this initial triage assessment does not replace that evaluation, and the importance of remaining in the ED until their evaluation is complete.  Patient here for evaluation of hypotension and dizziness with intermittent near syncopal episodes.  Will need further evaluation in the emergency department.  Patient agreeable to plan.   Pauline Aus, PA-C 08/25/20 1221    Jacalyn Lefevre, MD 08/25/20 1224

## 2020-08-25 NOTE — Discharge Instructions (Signed)
Your lab tests tonight are normal and your exam is reassuring.  I agree with your primary MD's suggestion that you have an evaluation by an endocrinologist.  Dr. Fransico Him may be available to see you - his information is provided above.

## 2020-09-10 ENCOUNTER — Ambulatory Visit: Payer: BC Managed Care – PPO | Admitting: "Endocrinology

## 2020-09-10 ENCOUNTER — Encounter: Payer: Self-pay | Admitting: "Endocrinology

## 2020-09-10 ENCOUNTER — Other Ambulatory Visit: Payer: Self-pay

## 2020-09-10 VITALS — BP 100/58 | HR 80 | Ht 64.5 in | Wt 99.4 lb

## 2020-09-10 DIAGNOSIS — E161 Other hypoglycemia: Secondary | ICD-10-CM

## 2020-09-10 DIAGNOSIS — E559 Vitamin D deficiency, unspecified: Secondary | ICD-10-CM | POA: Diagnosis not present

## 2020-09-10 DIAGNOSIS — R636 Underweight: Secondary | ICD-10-CM | POA: Diagnosis not present

## 2020-09-10 LAB — POCT GLYCOSYLATED HEMOGLOBIN (HGB A1C): Hemoglobin A1C: 5.1 % (ref 4.0–5.6)

## 2020-09-10 NOTE — Progress Notes (Signed)
Endocrinology Consult Note                                            09/10/2020, 3:00 PM   Subjective:    Patient ID: Misty Figueroa, female    DOB: Feb 12, 1996, PCP System, Provider Not In   Past Medical History:  Diagnosis Date  . Abdominal pain   . Fibromyalgia   . GERD (gastroesophageal reflux disease)   . Headache   . IBS (irritable bowel syndrome)   . Premature baby    born at 67 weeks  . Weight loss    Past Surgical History:  Procedure Laterality Date  . CHOLECYSTECTOMY    . COLONOSCOPY WITH PROPOFOL N/A 03/08/2016   Procedure: COLONOSCOPY WITH PROPOFOL;  Surgeon: West Bali, MD;  Location: [facility];  Service: Endoscopy;  Laterality: N/A;  1230   . ESOPHAGOGASTRODUODENOSCOPY (EGD) WITH PROPOFOL N/A 03/08/2016   Procedure: ESOPHAGOGASTRODUODENOSCOPY (EGD) WITH PROPOFOL;  Surgeon: West Bali, MD;  Location: [facility];  Service: Endoscopy;  Laterality: N/A;   Social History   Socioeconomic History  . Marital status: Single    Spouse name: Not on file  . Number of children: Not on file  . Years of education: Not on file  . Highest education level: Not on file  Occupational History  . Not on file  Tobacco Use  . Smoking status: Never Smoker  . Smokeless tobacco: Never Used  Vaping Use  . Vaping Use: Some days  Substance and Sexual Activity  . Alcohol use: Yes    Comment: occ  . Drug use: Yes    Types: Marijuana  . Sexual activity: Not Currently  Other Topics Concern  . Not on file  Social History Narrative  . Not on file   Social Determinants of Health   Financial Resource Strain: Not on file  Food Insecurity: Not on file  Transportation Needs: Not on file  Physical Activity: Not on file  Stress: Not on file  Social Connections: Not on file   Family History  Problem Relation Age of Onset  . Hypertension Father   . Hypertension Maternal Grandmother   . Stroke Paternal Grandmother   . Diabetes Paternal Grandmother   .  Pulmonary fibrosis Paternal Grandfather   . Colon cancer Neg Hx   . Crohn's disease Neg Hx   . Inflammatory bowel disease Neg Hx    Outpatient Encounter Medications as of 09/10/2020  Medication Sig  . Fremanezumab-vfrm (AJOVY [place]) Inject into the skin every 30 (thirty) days.  Marland Kitchen glucose 4 GM chewable tablet Chew 1 tablet by mouth as needed for low blood sugar.  . aspirin-acetaminophen-caffeine (EXCEDRIN MIGRAINE) 250-250-65 MG tablet Take 2 tablets by mouth every 6 (six) hours as needed for headache or migraine.  . diphenhydrAMINE (BENADRYL) 25 mg capsule Take 50 mg by mouth every 6 (six) hours as needed. Takes for hives (autoimmune disorder)   . ergocalciferol (VITAMIN D2) 1.25 MG (50000 UT) capsule Take 1 capsule by mouth once a week.  . escitalopram (LEXAPRO) 10 MG tablet Take 1 tablet by mouth daily.  . traZODone (DESYREL) 50 MG tablet Take 50 mg by mouth at bedtime.  Marland Kitchen Ubrogepant (UBRELVY) 100 MG TABS 1 tablet may take second dose at least 2 hours after first dose as needed  . [DISCONTINUED] Butalbital-APAP-Caff-Cod 50-300-40-30 MG CAPS  1 po q6h prn headache  . [DISCONTINUED] citalopram (CELEXA) 10 MG tablet Take 10 mg by mouth daily.  . [DISCONTINUED] doxycycline (ADOXA) 100 MG tablet Take 100 mg by mouth 2 (two) times daily.  . [DISCONTINUED] gabapentin (NEURONTIN) 300 MG capsule Take 1 capsule (300 mg total) by mouth 2 (two) times daily. (Patient not taking: Reported on 01/31/2017)  . [DISCONTINUED] HYDROcodone-acetaminophen (NORCO/VICODIN) 5-325 MG tablet Take 1 tablet by mouth every 4 (four) hours as needed. (Patient not taking: Reported on 01/31/2017)  . [DISCONTINUED] midodrine (PROAMATINE) 2.5 MG tablet Take 2.5 mg by mouth 3 (three) times daily with meals.  . [DISCONTINUED] Norethindrone-Ethinyl Estradiol-Fe Biphas (LO LOESTRIN FE) 1 MG-10 MCG / 10 MCG tablet Take 1 tablet by mouth daily.  . [DISCONTINUED] norgestimate-ethinyl estradiol (ORTHO-CYCLEN,SPRINTEC,PREVIFEM) 0.25-35 MG-MCG  tablet Take 1 tablet by mouth daily.  . [DISCONTINUED] omalizumab (XOLAIR) 150 MG injection Inject 300 mg into the skin every 28 (twenty-eight) days.  . [DISCONTINUED] PARoxetine (PAXIL) 20 MG tablet 1/ PO DAILY FOR 2 WEEKS AND INCREASE TO ONE TAB DAILY IF ABDOMINAL PAIN AND DIARRHEA ARE NOT IMPROVED. (Patient not taking: Reported on 09/30/2016)  . [DISCONTINUED] promethazine (PHENERGAN) 25 MG suppository Place 1 suppository (25 mg total) rectally every 6 (six) hours as needed for nausea or vomiting.  . [DISCONTINUED] traMADol (ULTRAM) 50 MG tablet Take 1 tablet (50 mg total) by mouth every 6 (six) hours as needed. (Patient not taking: Reported on 01/31/2017)  . [DISCONTINUED] zonisamide (ZONEGRAN) 25 MG capsule Take 25 mg by mouth daily. 4 Capsules once a day.   No facility-administered encounter medications on file as of 09/10/2020.   ALLERGIES: Allergies  Allergen Reactions  . Morphine And Related     Shaking episodes  . Penicillins Hives    Has patient had a PCN reaction causing immediate rash, facial/tongue/throat swelling, SOB or lightheadedness with hypotension: Yes Has patient had a PCN reaction causing severe rash involving mucus membranes or skin necrosis: No Has patient had a PCN reaction that required hospitalization No Has patient had a PCN reaction occurring within the last 10 years: No If all of the above answers are "NO", then may proceed with Cephalosporin use.   Marland Kitchen Prochlorperazine     Muscle spasms  . Latex Rash    VACCINATION STATUS:  There is no immunization history on file for this patient.  HPI Misty Figueroa is 25 y.o. female who presents today with a medical history as above. she is being seen in consultation for hypoglycemia requested by Grayland Jack, NP.  History is obtained directly from the patient as well as chart review.  She is accompanied by her mother. -She denies any prior history of diabetes, not on antidiabetic medications. She is on  multiple antipsychotic medications including Lexapro, trazodone, Ubrelvy, Ajovy.  Due to symptoms including anxiety, tremors, palpitations, sweating she was suspected to have hypoglycemia.  She was given a meter to check blood glucose.  She brought it with her and review did not show any hypoglycemia since she started using it on Aug 18, 2020.  Her average blood glucose is 98 over the last 7 days, 91 over the last 14 days, 96 over the last 30 days.  She has a total of 70 readings in the meter, none of them are below 70.  She noticed that most of her symptoms follow consumption of sweet desserts or sweetened beverages. At one point, she underwent glucose loading test which resulted hypoglycemia of 32 hour,1 hour later. Her point-of-care A1c  today is 5.1%.  Admittedly, she lives off of sweets.  She claims to have been eating "adequately". Patient denies any eating disorder.  She weighs 99 pounds today given her BMI of 16.8.   Review of Systems  Constitutional:  + Gives history of unintended weight loss down from 120 pounds, to just below  100lbs,  + fatigue, no subjective hyperthermia, no subjective hypothermia Eyes: no blurry vision, no xerophthalmia ENT: no sore throat, no nodules palpated in throat, no dysphagia/odynophagia, no hoarseness Cardiovascular: no Chest Pain, no Shortness of Breath, no palpitations, no leg swelling Respiratory: no cough, no shortness of breath Gastrointestinal: no Nausea/Vomiting/Diarhhea Musculoskeletal: no muscle/joint aches Skin: no rashes Neurological: + tremors, no numbness, no tingling, no dizziness Psychiatric: no depression, + anxiety  Objective:    Vitals with BMI 09/10/2020 08/25/2020 08/25/2020  Height 5' 4.5" - -  Weight 99 lbs 6 oz - -  BMI 16.8 - -  Systolic 100 106 638  Diastolic 58 63 66  Pulse 80 89 87    BP (!) 100/58   Pulse 80   Ht 5' 4.5" (1.638 m)   Wt 99 lb 6.4 oz (45.1 kg)   BMI 16.80 kg/m   Wt Readings from Last 3 Encounters:   09/10/20 99 lb 6.4 oz (45.1 kg)  04/15/18 105 lb (47.6 kg)  02/02/17 104 lb (47.2 kg)    Physical Exam  Constitutional:  Body mass index is 16.8 kg/m.,  not in acute distress, normal state of mind Eyes: PERRLA, EOMI, no exophthalmos ENT: moist mucous membranes, no gross thyromegaly, no gross cervical lymphadenopathy Cardiovascular: normal precordial activity, Regular Rate and Rhythm, no Murmur/Rubs/Gallops Respiratory:  adequate breathing efforts, no gross chest deformity, Clear to auscultation bilaterally Gastrointestinal: abdomen soft, Non -tender, No distension, Bowel Sounds present, no gross organomegaly Musculoskeletal: no gross deformities, strength intact in all four extremities Skin: moist, warm, no rashes Neurological: no tremor with outstretched hands, Deep tendon reflexes normal in bilateral lower extremities.  CMP ( most recent) CMP     Component Value Date/Time   NA 135 08/25/2020 1241   K 3.8 08/25/2020 1241   CL 107 08/25/2020 1241   CO2 24 08/25/2020 1241   GLUCOSE 91 08/25/2020 1241   BUN 12 08/25/2020 1241   BUN 13 06/19/2020 0000   CREATININE 0.69 08/25/2020 1241   CALCIUM 8.7 (L) 08/25/2020 1241   PROT 7.2 02/08/2016 0824   ALBUMIN 4.1 02/08/2016 0824   AST 13 06/19/2020 0000   ALT 14 06/19/2020 0000   ALKPHOS 53 02/08/2016 0824   BILITOT 0.4 02/08/2016 0824   GFRNONAA >60 08/25/2020 1241   GFRAA >60 09/30/2016 1542     Diabetic Labs (most recent): Lab Results  Component Value Date   HGBA1C 5.1 09/10/2020     Lab Results  Component Value Date   TSH 1.13 06/19/2020   TSH 0.466 01/07/2016         Recent Results (from the past 2160 hour(s))  VITAMIN D 25 Hydroxy (Vit-D Deficiency, Fractures)     Status: None   Collection Time: 06/19/20 12:00 AM  Result Value Ref Range   Vit D, 25-Hydroxy 13   Basic metabolic panel     Status: None   Collection Time: 06/19/20 12:00 AM  Result Value Ref Range   BUN 13 4 - 21   Creatinine 0.7 0.5 - 1.1    Potassium 4.2 3.4 - 5.3  Hepatic function panel     Status: None   Collection Time:  06/19/20 12:00 AM  Result Value Ref Range   ALT 14 7 - 35   AST 13 13 - 35  Vitamin B12     Status: None   Collection Time: 06/19/20 12:00 AM  Result Value Ref Range   Vitamin B-12 490   TSH     Status: None   Collection Time: 06/19/20 12:00 AM  Result Value Ref Range   TSH 1.13 0.41 - 5.90    Comment: free t4 1.27  Basic metabolic panel     Status: Abnormal   Collection Time: 08/25/20 12:41 PM  Result Value Ref Range   Sodium 135 135 - 145 mmol/L   Potassium 3.8 3.5 - 5.1 mmol/L   Chloride 107 98 - 111 mmol/L   CO2 24 22 - 32 mmol/L   Glucose, Bld 91 70 - 99 mg/dL    Comment: Glucose reference range applies only to samples taken after fasting for at least 8 hours.   BUN 12 6 - 20 mg/dL   Creatinine, Ser 4.090.69 0.44 - 1.00 mg/dL   Calcium 8.7 (L) 8.9 - 10.3 mg/dL   GFR, Estimated >81>60 >19>60 mL/min    Comment: (NOTE) Calculated using the CKD-EPI Creatinine Equation (2021)    Anion gap 4 (L) 5 - 15    Comment: Performed at [facility], [address] [place], [place] [address]  CBC with Differential     Status: None   Collection Time: 08/25/20 12:41 PM  Result Value Ref Range   WBC 5.6 4.0 - 10.5 K/uL   RBC 4.66 3.87 - 5.11 MIL/uL   Hemoglobin 13.9 12.0 - 15.0 g/dL   HCT 95.642.2 21.336.0 - 08.646.0 %   MCV 90.6 80.0 - 100.0 fL   MCH 29.8 26.0 - 34.0 pg   MCHC 32.9 30.0 - 36.0 g/dL   RDW 57.812.2 46.911.5 - 62.915.5 %   Platelets 243 150 - 400 K/uL   nRBC 0.0 0.0 - 0.2 %   Neutrophils Relative % 55 %   Neutro Abs 3.1 1.7 - 7.7 K/uL   Lymphocytes Relative 35 %   Lymphs Abs 1.9 0.7 - 4.0 K/uL   Monocytes Relative 6 %   Monocytes Absolute 0.4 0.1 - 1.0 K/uL   Eosinophils Relative 3 %   Eosinophils Absolute 0.1 0.0 - 0.5 K/uL   Basophils Relative 1 %   Basophils Absolute 0.0 0.0 - 0.1 K/uL   Immature Granulocytes 0 %   Abs Immature Granulocytes 0.01 0.00 - 0.07 K/uL    Comment: Performed at [facility], [address] [place], [place] [address]  D-dimer, quantitative     Status: None   Collection Time: 08/25/20 12:41 PM  Result Value Ref Range   D-Dimer, Quant 0.27 0.00 - 0.50 ug/mL-FEU    Comment: (NOTE) At the manufacturer cut-off value of 0.5 g/mL FEU, this assay has a negative predictive value of 95-100%.This assay is intended for use in conjunction with a clinical pretest probability (PTP) assessment model to exclude pulmonary embolism (PE) and deep venous thrombosis (DVT) in outpatients suspected of PE or DVT. Results should be correlated with clinical presentation. Performed at [facility], [address] [place], [place] [address]   Urinalysis, Routine w reflex microscopic Urine, Clean Catch     Status: Abnormal   Collection Time: 08/25/20  4:35 PM  Result Value Ref Range   Color, Urine YELLOW YELLOW   APPearance HAZY (A) CLEAR   Specific Gravity, Urine 1.017 1.005 - 1.030  pH 6.0 5.0 - 8.0   Glucose, UA NEGATIVE NEGATIVE mg/dL   Hgb urine dipstick NEGATIVE NEGATIVE   Bilirubin Urine NEGATIVE NEGATIVE   Ketones, ur NEGATIVE NEGATIVE mg/dL   Protein, ur NEGATIVE NEGATIVE mg/dL   Nitrite NEGATIVE NEGATIVE   Leukocytes,Ua NEGATIVE NEGATIVE    Comment: Performed at [facility], [address] [place], [place] [address]  Pregnancy, urine     Status: None   Collection Time: 08/25/20  4:35 PM  Result Value Ref Range   Preg Test, Ur NEGATIVE NEGATIVE    Comment:        THE SENSITIVITY OF THIS METHODOLOGY IS >20 mIU/mL. Performed at [facility], [address], [place], [place] [address]   HgB A1c     Status: None   Collection Time: 09/10/20  2:23 PM  Result Value Ref Range   Hemoglobin A1C 5.1 4.0 - 5.6 %   HbA1c POC (<> result, manual entry)     HbA1c, POC (prediabetic range)     HbA1c, POC (controlled diabetic range)     Her recent work-up was negative for thyroid dysfunction, insulin was 8.6, C-peptide was 3.5 was normal.  Assessment & Plan:   1. Reactive  hypoglycemia 2.  Underweight   - Misty Figueroa  is being seen at a kind request of System, Provider Not In. - I have reviewed her available endocrine records and clinically evaluated the patient. 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  She is moderate nutritional deficiency.  -She is unlikely to have insulin producing neoplasm.  Her accompanying paperwork showed normal insulin, C-peptide, thyroid function test.  Her point-of-care A1c was 5.1%. I discussed best approach to fix this problem with dietary modification.  - she acknowledges that there is a room for improvement in her food and drink choices. - Suggestion is made for her to avoid simple carbohydrates  from her diet including Cakes, Sweet Desserts, Ice Cream, Soda (diet and regular), Sweet Tea, Candies, Chips, Cookies, Store Bought Juices, Alcohol in Excess of  1-2 drinks a day, Artificial Sweeteners,  Coffee Creamer, and "Sugar-free" Products, Lemonade.   And she is instructed to consume 3 meals a day averaging 700 cal each, and free to snack with nuts, fruits in between.   -She will allow to introduce more protein during all of her meals. -This will help stabilize her glycemic profile, and help her regain some of her weight.  I discussed normal weight range of 120 - 140 lbs for her. -She has a meter, advised to use it when she is symptomatic before she corrected.  She is encouraged to call clinic if she document hypoglycemia below 70 or above 300.  -Trazodone can cause symptoms similar to hypoglycemia.  However, she believes trazodone is helping her and wishes to continue on it. - I did not initiate any new prescriptions today.  -She is advised to continue vitamin D to 50,000 units weekly.   - she is advised to maintain close follow up with Grayland Jack, NP.  for her primary care needs.   - Time spent with the patient: 60 minutes, of which >50% was spent in   counseling her about her reactive hypoglycemia, underweight status and the rest in obtaining information about her symptoms, reviewing her previous labs/studies ( including abstractions from other facilities),  evaluations, and treatments,  and developing a plan to confirm diagnosis and long term treatment based on the latest standards of care/guidelines;  and documenting her care.  Misty Figueroa participated in the discussions, expressed understanding, and voiced agreement with the above plans.  All questions were answered to her satisfaction. she is encouraged to contact clinic should she have any questions or concerns prior to her return visit.  Follow up plan: Return in about 4 weeks (around 10/08/2020) for F/U with Meter and Logs Only - no Labs.   Marquis Lunch, MD [facility] [address] [place], [place] [address] Phone: [phone]  Fax: [phone]     09/10/2020, 3:00 PM  This note was partially dictated with voice recognition software. Similar sounding words can be transcribed inadequately or may not  be corrected upon review.

## 2020-10-08 ENCOUNTER — Ambulatory Visit: Payer: BC Managed Care – PPO | Admitting: "Endocrinology
